# Patient Record
Sex: Female | Born: 1995
Health system: Southern US, Community
[De-identification: ages and names within clinical notes are randomized; demographics above are authoritative.]

## PROBLEM LIST (undated history)

## (undated) DIAGNOSIS — N946 Dysmenorrhea, unspecified: Secondary | ICD-10-CM

## (undated) DIAGNOSIS — J45909 Unspecified asthma, uncomplicated: Secondary | ICD-10-CM

## (undated) DIAGNOSIS — F419 Anxiety disorder, unspecified: Secondary | ICD-10-CM

## (undated) DIAGNOSIS — N939 Abnormal uterine and vaginal bleeding, unspecified: Secondary | ICD-10-CM

## (undated) DIAGNOSIS — F3181 Bipolar II disorder: Secondary | ICD-10-CM

## (undated) DIAGNOSIS — D649 Anemia, unspecified: Secondary | ICD-10-CM

## (undated) DIAGNOSIS — F329 Major depressive disorder, single episode, unspecified: Secondary | ICD-10-CM

## (undated) DIAGNOSIS — F32A Depression, unspecified: Secondary | ICD-10-CM

## (undated) HISTORY — DX: Abnormal uterine and vaginal bleeding, unspecified: N93.9

## (undated) HISTORY — DX: Anemia, unspecified: D64.9

## (undated) HISTORY — DX: Unspecified asthma, uncomplicated: J45.909

## (undated) HISTORY — DX: Bipolar II disorder: F31.81

## (undated) HISTORY — DX: Major depressive disorder, single episode, unspecified: F32.9

## (undated) HISTORY — PX: WISDOM TOOTH EXTRACTION: SHX21

## (undated) HISTORY — DX: Anxiety disorder, unspecified: F41.9

## (undated) HISTORY — DX: Depression, unspecified: F32.A

## (undated) HISTORY — DX: Dysmenorrhea, unspecified: N94.6

---
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
## 2018-01-08 ENCOUNTER — Encounter: Payer: Self-pay | Admitting: Family Medicine

## 2018-01-08 ENCOUNTER — Ambulatory Visit (INDEPENDENT_AMBULATORY_CARE_PROVIDER_SITE_OTHER): Payer: No Typology Code available for payment source | Admitting: Family Medicine

## 2018-01-08 VITALS — BP 118/60 | HR 85 | Temp 98.2°F | Ht 65.0 in | Wt 120.2 lb

## 2018-01-08 DIAGNOSIS — E559 Vitamin D deficiency, unspecified: Secondary | ICD-10-CM | POA: Diagnosis not present

## 2018-01-08 DIAGNOSIS — R5383 Other fatigue: Secondary | ICD-10-CM

## 2018-01-08 DIAGNOSIS — Z0000 Encounter for general adult medical examination without abnormal findings: Secondary | ICD-10-CM

## 2018-01-08 DIAGNOSIS — F411 Generalized anxiety disorder: Secondary | ICD-10-CM

## 2018-01-08 DIAGNOSIS — Z114 Encounter for screening for human immunodeficiency virus [HIV]: Secondary | ICD-10-CM | POA: Diagnosis not present

## 2018-01-08 DIAGNOSIS — E538 Deficiency of other specified B group vitamins: Secondary | ICD-10-CM

## 2018-01-08 DIAGNOSIS — Z13220 Encounter for screening for lipoid disorders: Secondary | ICD-10-CM

## 2018-01-08 DIAGNOSIS — Z789 Other specified health status: Secondary | ICD-10-CM | POA: Diagnosis not present

## 2018-01-08 LAB — CBC WITH DIFFERENTIAL/PLATELET
Basophils Absolute: 0 10*3/uL (ref 0.0–0.1)
Basophils Relative: 0.6 % (ref 0.0–3.0)
Eosinophils Absolute: 0.1 10*3/uL (ref 0.0–0.7)
Eosinophils Relative: 1.7 % (ref 0.0–5.0)
HCT: 38 % (ref 36.0–46.0)
Hemoglobin: 12.6 g/dL (ref 12.0–15.0)
Lymphocytes Relative: 47 % — ABNORMAL HIGH (ref 12.0–46.0)
Lymphs Abs: 2.3 10*3/uL (ref 0.7–4.0)
MCHC: 33 g/dL (ref 30.0–36.0)
MCV: 88.7 fl (ref 78.0–100.0)
Monocytes Absolute: 0.3 10*3/uL (ref 0.1–1.0)
Monocytes Relative: 5.4 % (ref 3.0–12.0)
Neutro Abs: 2.3 10*3/uL (ref 1.4–7.7)
Neutrophils Relative %: 45.3 % (ref 43.0–77.0)
Platelets: 212 10*3/uL (ref 150.0–400.0)
RBC: 4.29 Mil/uL (ref 3.87–5.11)
RDW: 13.3 % (ref 11.5–15.5)
WBC: 5 10*3/uL (ref 4.0–10.5)

## 2018-01-08 LAB — COMPREHENSIVE METABOLIC PANEL
ALT: 13 U/L (ref 0–35)
AST: 16 U/L (ref 0–37)
Albumin: 4.5 g/dL (ref 3.5–5.2)
Alkaline Phosphatase: 77 U/L (ref 39–117)
BUN: 7 mg/dL (ref 6–23)
CO2: 25 mEq/L (ref 19–32)
Calcium: 9.4 mg/dL (ref 8.4–10.5)
Chloride: 106 mEq/L (ref 96–112)
Creatinine, Ser: 0.72 mg/dL (ref 0.40–1.20)
GFR: 106.78 mL/min (ref >60.00)
Glucose, Bld: 93 mg/dL (ref 70–99)
Potassium: 4 mEq/L (ref 3.5–5.1)
Sodium: 139 mEq/L (ref 135–145)
Total Bilirubin: 0.5 mg/dL (ref 0.2–1.2)
Total Protein: 6.9 g/dL (ref 6.0–8.3)

## 2018-01-08 LAB — LIPID PANEL
Cholesterol: 120 mg/dL (ref 0–200)
HDL: 41.6 mg/dL (ref >39.00)
LDL Cholesterol: 63 mg/dL (ref 0–99)
NonHDL: 78.6
Total CHOL/HDL Ratio: 3
Triglycerides: 79 mg/dL (ref 0.0–149.0)
VLDL: 15.8 mg/dL (ref 0.0–40.0)

## 2018-01-08 LAB — TSH: TSH: 1.16 u[IU]/mL (ref 0.35–4.50)

## 2018-01-08 LAB — VITAMIN B12: Vitamin B-12: 140 pg/mL — ABNORMAL LOW (ref 211–911)

## 2018-01-08 LAB — VITAMIN D 25 HYDROXY (VIT D DEFICIENCY, FRACTURES): VITD: 20.5 ng/mL — ABNORMAL LOW (ref 30.00–100.00)

## 2018-01-08 NOTE — Progress Notes (Signed)
Subjective:    Sylvia Mathews is a 22 y.o. female and is here for a comprehensive physical exam.   Current Outpatient Medications:  .  buPROPion (WELLBUTRIN XL) 300 MG 24 hr tablet, Take 300 mg by mouth daily., Disp: , Rfl:  .  busPIRone (BUSPAR) 10 MG tablet, Take 20 mg by mouth daily., Disp: , Rfl:   Health Maintenance Due  Topic Date Due  . HIV Screening  02/10/2010  . TETANUS/TDAP  02/10/2014  . PAP SMEAR  02/11/2016    PMHx, SurgHx, SocialHx, Medications, and Allergies were reviewed in the Visit Navigator and updated as appropriate.   Past Medical History:  Diagnosis Date  . Asthma, childhood, exercise-induced   . Depression     History reviewed. No pertinent surgical history.   Family History  Problem Relation Age of Onset  . Heart attack Maternal Grandfather   . Hyperlipidemia Maternal Grandfather   . Mental illness Maternal Grandfather     Social History   Tobacco Use  . Smoking status: Never Smoker  . Smokeless tobacco: Never Used  Substance Use Topics  . Alcohol use: Yes    Comment: OCC  . Drug use: Never    Review of Systems:   Pertinent items are noted in the HPI. Otherwise, ROS is negative.  Objective:   BP 118/60   Pulse 85   Temp 98.2 F (36.8 C) (Oral)   Ht 5\' 5"  (1.651 m)   Wt 120 lb 3.2 oz (54.5 kg)   LMP 01/06/2018   SpO2 98%   BMI 20.00 kg/m    General appearance: alert, cooperative and appears stated age. Head: normocephalic, without obvious abnormality, atraumatic. Neck: no adenopathy, supple, symmetrical, trachea midline; thyroid not enlarged, symmetric, no tenderness/mass/nodules. Lungs: clear to auscultation bilaterally. Heart: regular rate and rhythm Abdomen: soft, non-tender; no masses,  no organomegaly. Extremities: extremities normal, atraumatic, no cyanosis or edema. Skin: skin color, texture, turgor normal, no rashes or lesions. Lymph: cervical, supraclavicular, and axillary nodes normal; no abnormal  inguinal nodes palpated. Neurologic: grossly normal.  Assessment/Plan:   Jamisyn was seen today for establish care.  Diagnoses and all orders for this visit:  Routine physical examination  Screening for HIV (human immunodeficiency virus) -     HIV Antibody (routine testing w rflx)  Vitamin D deficiency -     VITAMIN D 25 Hydroxy (Vit-D Deficiency, Fractures)  Other fatigue -     CBC with Differential/Platelet -     Comprehensive metabolic panel -     TSH -     Vitamin B12  Vegetarian -     CBC with Differential/Platelet -     Comprehensive metabolic panel  GAD (generalized anxiety disorder)  Screening for lipid disorders -     Lipid panel   Patient Counseling:   [x]     Nutrition: Stressed importance of moderation in sodium/caffeine intake, saturated fat and cholesterol, caloric balance, sufficient intake of fresh fruits, vegetables, fiber, calcium, iron, and 1 mg of folate supplement per day (for females capable of pregnancy).   [x]      Stressed the importance of regular exercise.    [x]     Substance Abuse: Discussed cessation/primary prevention of tobacco, alcohol, or other drug use; driving or other dangerous activities under the influence; availability of treatment for abuse.    [x]      Injury prevention: Discussed safety belts, safety helmets, smoke detector, smoking near bedding or upholstery.    [x]      Sexuality: Discussed  sexually transmitted diseases, partner selection, use of condoms, avoidance of unintended pregnancy  and contraceptive alternatives.    [x]     Dental health: Discussed importance of regular tooth brushing, flossing, and dental visits.   [x]      Health maintenance and immunizations reviewed. Please refer to Health maintenance section.   Helane RimaErica Demarlo Riojas, DO [place] [facility]

## 2018-01-09 ENCOUNTER — Encounter: Payer: Self-pay | Admitting: Family Medicine

## 2018-01-09 DIAGNOSIS — E559 Vitamin D deficiency, unspecified: Secondary | ICD-10-CM | POA: Insufficient documentation

## 2018-01-09 DIAGNOSIS — Z789 Other specified health status: Secondary | ICD-10-CM | POA: Insufficient documentation

## 2018-01-09 DIAGNOSIS — E538 Deficiency of other specified B group vitamins: Secondary | ICD-10-CM | POA: Insufficient documentation

## 2018-01-09 LAB — HIV ANTIBODY (ROUTINE TESTING W REFLEX): HIV 1&2 Ab, 4th Generation: NONREACTIVE

## 2018-01-31 ENCOUNTER — Ambulatory Visit (INDEPENDENT_AMBULATORY_CARE_PROVIDER_SITE_OTHER): Payer: No Typology Code available for payment source | Admitting: Psychiatry

## 2018-01-31 DIAGNOSIS — F411 Generalized anxiety disorder: Secondary | ICD-10-CM | POA: Diagnosis not present

## 2018-01-31 DIAGNOSIS — F3289 Other specified depressive episodes: Secondary | ICD-10-CM

## 2018-01-31 MED ORDER — BUPROPION HCL ER (XL) 300 MG PO TB24: 300.0000 mg | ORAL_TABLET | Freq: Every day | ORAL | 1 refills | Status: DC

## 2018-01-31 MED ORDER — BUSPIRONE HCL 10 MG PO TABS: ORAL_TABLET | ORAL | 0 refills | Status: DC

## 2018-01-31 MED ORDER — DULOXETINE HCL 30 MG PO CPEP: 30.0000 mg | ORAL_CAPSULE | Freq: Every day | ORAL | 0 refills | Status: DC

## 2018-01-31 MED ORDER — DULOXETINE HCL 60 MG PO CPEP: 60.0000 mg | ORAL_CAPSULE | Freq: Every day | ORAL | 0 refills | Status: DC

## 2018-01-31 MED FILL — DULoxetine HCL 60 MG CPEP: 60 | 30 days supply | Qty: 30 | Fill #0

## 2018-01-31 MED FILL — DULoxetine HCL 30 MG CPEP: 30 | 7 days supply | Qty: 7 | Fill #0

## 2018-01-31 MED FILL — buPROPion HCL ER (XL) 300 M: 300 | 30 days supply | Qty: 30 | Fill #0

## 2018-01-31 MED FILL — busPIRone HCL 10 MG TABS: 10 | 30 days supply | Qty: 90 | Fill #0
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
## 2018-02-22 ENCOUNTER — Ambulatory Visit (INDEPENDENT_AMBULATORY_CARE_PROVIDER_SITE_OTHER): Payer: No Typology Code available for payment source | Admitting: Psychiatry

## 2018-02-22 DIAGNOSIS — F329 Major depressive disorder, single episode, unspecified: Secondary | ICD-10-CM

## 2018-02-22 DIAGNOSIS — F411 Generalized anxiety disorder: Secondary | ICD-10-CM | POA: Diagnosis not present

## 2018-02-22 DIAGNOSIS — F32A Depression, unspecified: Secondary | ICD-10-CM

## 2018-02-22 MED ORDER — BUPROPION HCL ER (XL) 300 MG PO TB24: 300.0000 mg | ORAL_TABLET | Freq: Every day | ORAL | 1 refills | Status: DC

## 2018-02-22 MED ORDER — DULOXETINE HCL 30 MG PO CPEP: 30.0000 mg | ORAL_CAPSULE | Freq: Every day | ORAL | 1 refills | Status: DC

## 2018-02-22 MED FILL — buPROPion HCL ER (XL) 300 M: 300 | 30 days supply | Qty: 30 | Fill #0

## 2018-02-22 MED FILL — DULoxetine HCL 30 MG CPEP: 30 | 30 days supply | Qty: 30 | Fill #0

## 2018-02-22 NOTE — Progress Notes (Signed)
[facility] Med Check  Patient ID: Sylvia Mathews,  MRN: [id number]  PCP: Helane Rima, DO  Date of Evaluation: 02/22/2018 Time spent:20 minutes  Chief Complaint:   HISTORY/CURRENT STATUS: HPI patient's initial visit was 01/31/2018.  Diagnosis of anxiety and depression.  She was continued on Wellbutrin XL 300 mg.  We increased her BuSpar to 10 mg 3 times daily.  Cymbalta was started 30 to 60 mg a day. Several days after starting Cymbalta and increasing BuSpar she was hyper focused for 3 days.  She was restless, goal oriented and productive.  No talking more, no grandiosity, not impulsive, focus was good.  Those symptoms have resolved. Patient feels anxiety and depression are slightly better. Currently she feels numb.   Individual Medical History/ Review of Systems: Changes? :No   Allergies: Amoxicillin  Current Medications:  Current Outpatient Medications:  .  buPROPion (WELLBUTRIN XL) 300 MG 24 hr tablet, Take 300 mg by mouth daily., Disp: , Rfl:  .  buPROPion (WELLBUTRIN XL) 300 MG 24 hr tablet, Take 1 tablet (300 mg total) by mouth daily., Disp: 30 tablet, Rfl: 1 .  busPIRone (BUSPAR) 10 MG tablet, Take 1 tab tid, Disp: 90 tablet, Rfl: 0 .  DULoxetine (CYMBALTA) 30 MG capsule, Take 1 capsule (30 mg total) by mouth daily., Disp: 7 capsule, Rfl: 0 .  DULoxetine (CYMBALTA) 60 MG capsule, Take 1 capsule (60 mg total) by mouth daily., Disp: 30 capsule, Rfl: 0 Medication Side Effects: none  Family Medical/ Social History: Changes? no  MENTAL HEALTH EXAM:  There were no vitals taken for this visit.There is no height or weight on file to calculate BMI.  General Appearance: Casual  Eye Contact:  Good  Speech:  Clear and Coherent  Volume:  Normal  Mood:  Euthymic  Affect:  Appropriate  Thought Process:  Goal Directed  Orientation:  Full (Time, Place, and Person)  Thought Content: Logical   Suicidal Thoughts:  No  Homicidal Thoughts:  No  Memory:  WNL  Judgement:   Good  Insight:  Good  Psychomotor Activity:  Normal  Concentration:  Concentration: Good  Recall:  Good  Fund of Knowledge: Good  Language: Good  Assets:  Desire for Improvement  ADL's:  Intact  Cognition: WNL  Prognosis:  Good    DIAGNOSES: No diagnosis found.  Receiving Psychotherapy: No    RECOMMENDATIONS: Several options were given to the patient.  She can decrease her Cymbalta back to 30 mg a day and continue BuSpar and Wellbutrin.  We could switch her Cymbalta to Trintellix or Viibryd.  She she opts to to decrease her Cymbalta to 30 mg a day, continue Wellbutrin XL 300 a day, continue BuSpar 10 mg 3 times daily. She is to return in 1 month   [employer], [place]

## 2018-02-28 ENCOUNTER — Ambulatory Visit: Payer: No Typology Code available for payment source | Admitting: Psychiatry

## 2018-03-08 ENCOUNTER — Ambulatory Visit: Payer: No Typology Code available for payment source | Admitting: Physician Assistant

## 2018-03-22 ENCOUNTER — Ambulatory Visit (INDEPENDENT_AMBULATORY_CARE_PROVIDER_SITE_OTHER): Payer: No Typology Code available for payment source | Admitting: Psychiatry

## 2018-03-22 DIAGNOSIS — F411 Generalized anxiety disorder: Secondary | ICD-10-CM

## 2018-03-22 DIAGNOSIS — F329 Major depressive disorder, single episode, unspecified: Secondary | ICD-10-CM | POA: Diagnosis not present

## 2018-03-22 DIAGNOSIS — F32A Depression, unspecified: Secondary | ICD-10-CM

## 2018-03-22 MED ORDER — BUSPIRONE HCL 10 MG PO TABS: ORAL_TABLET | ORAL | 1 refills | Status: DC

## 2018-03-22 MED ORDER — BUPROPION HCL ER (XL) 300 MG PO TB24: 300.0000 mg | ORAL_TABLET | Freq: Every day | ORAL | 1 refills | Status: DC

## 2018-03-22 MED FILL — busPIRone HCL 10 MG TABS: 10 | 30 days supply | Qty: 150 | Fill #0

## 2018-03-22 MED FILL — buPROPion HCL ER (XL) 300 M: 300 | 30 days supply | Qty: 30 | Fill #0

## 2018-03-22 NOTE — Progress Notes (Signed)
[facility] Med Check  Patient ID: Sylvia Mathews,  MRN: [id number]  PCP: Helane Rima, DO  Date of Evaluation: 03/22/2018 Time spent:20 minutes  Chief Complaint:   HISTORY/CURRENT STATUS: HPI patient seen 1 month ago.  Her symptoms were slightly better.  She has been treating for anxiety and depression.  At last visit we decreased her Cymbalta to 30 mg a day, continue Wellbutrin XL 300 a day and continue BuSpar 10 mg 3 times daily she has some hyperactivity when she started Cymbalta and increased her BuSpar that as why we decrease the Cymbalta back to 30.  Anxiety and depression are better.  She still feels flat and numb to things     Individual Medical History/ Review of Systems: Changes? :No   Allergies: Amoxicillin  Current Medications:  Current Outpatient Medications:  .  buPROPion (WELLBUTRIN XL) 300 MG 24 hr tablet, Take 1 tablet (300 mg total) by mouth daily., Disp: 30 tablet, Rfl: 1 .  busPIRone (BUSPAR) 10 MG tablet, 2 tabs in am, 1 tab at lunch, 2 tabs in pm, Disp: 150 tablet, Rfl: 1 Medication Side Effects: none  Family Medical/ Social History: Changes? No  MENTAL HEALTH EXAM:  There were no vitals taken for this visit.There is no height or weight on file to calculate BMI.  General Appearance: Casual  Eye Contact:  Good  Speech:  Clear and Coherent  Volume:  Normal  Mood:  Euthymic  Affect:  Appropriate  Thought Process:  Linear  Orientation:  Full (Time, Place, and Person)  Thought Content: Logical   Suicidal Thoughts:  No  Homicidal Thoughts:  No  Memory:  WNL  Judgement:  Good  Insight:  Good  Psychomotor Activity:  Normal  Concentration:  Concentration: Good  Recall:  Good  Fund of Knowledge: Good  Language: Good  Assets:  Resilience  ADL's:  Intact  Cognition: WNL  Prognosis:  Good    DIAGNOSES:    ICD-10-CM   1. Depression, unspecified depression type F32.9   2. GAD (generalized anxiety disorder) F41.1 buPROPion (WELLBUTRIN  XL) 300 MG 24 hr tablet    busPIRone (BUSPAR) 10 MG tablet    Receiving Psychotherapy: No    RECOMMENDATIONS: Nahomy's main concern today is the anxiety and the feeling flat with no emotions.  We will stop her Cymbalta.  She is discontinuing Wellbutrin XL 300 mg a day.  She is going to increase her BuSpar from 10 mg 3 times daily to 20 mg in the morning 1 at lunch and 2 in the evening.  We will see her back in 1 month.  In the past she has tried Prozac, Celexa, Vistaril, Cymbalta, and Wellbutrin.   Anne Fu, PA-C

## 2018-04-04 MED FILL — busPIRone HCL 10 MG TABS: 10 | 30 days supply | Qty: 150 | Fill #0

## 2018-04-04 MED FILL — buPROPion HCL ER (XL) 300 M: 300 | 30 days supply | Qty: 30 | Fill #0

## 2018-04-05 ENCOUNTER — Encounter: Payer: Self-pay | Admitting: Physician Assistant

## 2018-04-05 ENCOUNTER — Other Ambulatory Visit (HOSPITAL_COMMUNITY)
Admission: RE | Admit: 2018-04-05 | Discharge: 2018-04-05 | Disposition: A | Discharge status: Home / Self Care | Payer: No Typology Code available for payment source | Source: Ambulatory Visit | Attending: Physician Assistant | Admitting: Physician Assistant

## 2018-04-05 ENCOUNTER — Telehealth: Payer: Self-pay | Admitting: Family Medicine

## 2018-04-05 ENCOUNTER — Ambulatory Visit (INDEPENDENT_AMBULATORY_CARE_PROVIDER_SITE_OTHER): Payer: No Typology Code available for payment source | Admitting: Physician Assistant

## 2018-04-05 VITALS — BP 110/80 | HR 90 | Temp 97.3°F | Ht 65.0 in | Wt 112.0 lb

## 2018-04-05 DIAGNOSIS — Z202 Contact with and (suspected) exposure to infections with a predominantly sexual mode of transmission: Secondary | ICD-10-CM | POA: Diagnosis present

## 2018-04-05 NOTE — Telephone Encounter (Signed)
Called patient put on your schedule this afternoon.

## 2018-04-05 NOTE — Patient Instructions (Signed)

## 2018-04-05 NOTE — Progress Notes (Signed)
Sylvia Mathews is a 23 y.o. female here for a new problem.  I acted as a Neurosurgeon for [employer], PA-C Sylvia Mull, LPN  History of Present Illness:   Chief Complaint  Patient presents with  . Sexual Transmitted infection    Pt had unprotected sex 1 week ago    Female GU Problem  The patient's primary symptoms include genital itching. This is a new problem. Episode onset: Started a week ago. The problem occurs intermittently. The problem has been unchanged. The patient is experiencing no pain. The problem affects both sides. She is not pregnant. Pertinent negatives include no chills, discolored urine, dysuria, fever, headaches, nausea, painful intercourse or vomiting. Vaginal discharge characteristics: bright red/brown. The vaginal bleeding is spotting (periods irregular due to IUD). She has not been passing clots. She has not been passing tissue. Nothing aggravates the symptoms. She has tried nothing for the symptoms. She is sexually active. No, her partner does not have an STD. She uses an IUD and condoms (pt had unprotected sex a week ago) for contraception. Her menstrual history has been irregular. There is no history of an STD.   She states that she may had a female and female partner. No history of STDs.  No LMP recorded. (Menstrual status: IUD). Currently on period, per patient.   Past Medical History:  Diagnosis Date  . Asthma, childhood, exercise-induced   . Depression      Social History   Socioeconomic History  . Marital status: Single    Spouse name: Not on file  . Number of children: Not on file  . Years of education: Not on file  . Highest education level: Bachelor's degree (e.g., BA, AB, BS)  Occupational History  . 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 Financial resource strain: Not on file  . Food insecurity:    Worry: Not on file    Inability: Not on file  . Transportation needs:    Medical:  Not on file    Non-medical: Not on file  Tobacco Use  . Smoking status: Never Smoker  . Smokeless tobacco: Never Used  Substance and Sexual Activity  . Alcohol use: Yes    Comment: OCC  . Drug use: Never  . Sexual activity: Yes    Partners: Female, Female    Birth control/protection: I.U.D.  Lifestyle  . Physical activity:    Days per week: Not on file    Minutes per session: Not on file  . Stress: Not on file  Relationships  . Social connections:    Talks on phone: Not on file    Gets together: Not on file    Attends religious service: Not on file    Active member of club or organization: Not on file    Attends meetings of clubs or organizations: Not on file    Relationship status: Not on file  . Intimate partner violence:    Fear of current or ex partner: Not on file    Emotionally abused: Not on file    Physically abused: Not on file    Forced sexual activity: Not on file  Other Topics Concern  . Not on file  Social History Narrative  . Not on file    History reviewed. No pertinent surgical history.  Family History  Problem Relation Age of Onset  . Heart attack Maternal Grandfather   . Hyperlipidemia Maternal Grandfather   .  Mental illness Maternal Grandfather     Allergies  Allergen Reactions  . Amoxicillin Rash    Current Medications:   Current Outpatient Medications:  .  buPROPion (WELLBUTRIN XL) 300 MG 24 hr tablet, Take 1 tablet (300 mg total) by mouth daily., Disp: 30 tablet, Rfl: 1 .  busPIRone (BUSPAR) 10 MG tablet, 2 tabs in am, 1 tab at lunch, 2 tabs in pm, Disp: 150 tablet, Rfl: 1   Review of Systems:   Review of Systems  Constitutional: Negative for chills and fever.  Gastrointestinal: Negative for nausea and vomiting.  Genitourinary: Negative for dysuria.  Neurological: Negative for headaches.    Vitals:   Vitals:   04/05/18 1329  BP: 110/80  Pulse: 90  Temp: (!) 97.3 F (36.3 C)  TempSrc: Oral  SpO2: 99%  Weight: 112 lb (50.8  kg)  Height: 5\' 5"  (1.651 m)     Body mass index is 18.64 kg/m.  Physical Exam:   Physical Exam Vitals signs and nursing note reviewed. Exam conducted with a chaperone present.  Constitutional:      General: She is not in acute distress.    Appearance: She is well-developed. She is not ill-appearing or toxic-appearing.  Cardiovascular:     Rate and Rhythm: Normal rate and regular rhythm.     Pulses: Normal pulses.     Heart sounds: Normal heart sounds, S1 normal and S2 normal.     Comments: No LE edema Pulmonary:     Effort: Pulmonary effort is normal.     Breath sounds: Normal breath sounds.  Genitourinary:    Labia:        Right: No rash, tenderness or lesion.        Left: No rash, tenderness or lesion.      Vagina: Normal. No tenderness or bleeding.     Cervix: No friability or erythema.     Uterus: Normal.      Adnexa: Right adnexa normal and left adnexa normal.     Comments: No lesions present. Possible irritated hair follicles around lower pubic hair line. Skin:    General: Skin is warm and dry.  Neurological:     Mental Status: She is alert.     GCS: GCS eye subscore is 4. GCS verbal subscore is 5. GCS motor subscore is 6.  Psychiatric:        Speech: Speech normal.        Behavior: Behavior normal. Behavior is cooperative.     Assessment and Plan:   Sylvia Mathews was seen today for sexual transmitted infection.  Diagnoses and all orders for this visit:  Possible exposure to STD -     RPR -     HIV Antibody (routine testing w rflx) -     Cervicovaginal ancillary only( Sylvia Mathews)   No red flags on exam. No visible lesions. No CMT or other concerns. Vaginal swab and RPR/HIV obtained on patient. Encouraged safe sex use. Avoid sexual activity until results have returned.   . Reviewed expectations re: course of current medical issues. . Discussed self-management of symptoms. . Outlined signs and symptoms indicating need for more acute intervention. . Patient  verbalized understanding and all questions were answered. . See orders for this visit as documented in the electronic medical record. . Patient received an After-Visit Summary.  CMA or LPN served as scribe during this visit. History, Physical, and Plan performed by medical provider. The above documentation has been reviewed and is accurate and complete.  Inda Coke, PA-C

## 2018-04-05 NOTE — Telephone Encounter (Signed)
OK for pt to see another provider or work in pt?   Copied from CRM [id number]. Topic: Appointment Scheduling - Scheduling Inquiry for Clinic >> Apr 04, 2018  3:01 PM Windy Kalata, NT wrote: Reason for CRM: patient is calling and would like to come in and see Dr. Earlene Plater for a STD screening. First available is 04/22/18 and she would like to be seen before then.

## 2018-04-08 ENCOUNTER — Other Ambulatory Visit: Payer: Self-pay | Admitting: Physician Assistant

## 2018-04-08 LAB — CERVICOVAGINAL ANCILLARY ONLY
Bacterial vaginitis: POSITIVE — AB
CANDIDA VAGINITIS: NEGATIVE
Chlamydia: NEGATIVE
Neisseria Gonorrhea: NEGATIVE
Trichomonas: NEGATIVE

## 2018-04-08 LAB — RPR: RPR Ser Ql: NONREACTIVE

## 2018-04-08 LAB — HIV ANTIBODY (ROUTINE TESTING W REFLEX): HIV: NONREACTIVE

## 2018-04-08 MED ORDER — METRONIDAZOLE 500 MG PO TABS: 500.0000 mg | ORAL_TABLET | Freq: Two times a day (BID) | ORAL | 0 refills | Status: AC

## 2018-04-09 ENCOUNTER — Telehealth: Payer: Self-pay | Admitting: Family Medicine

## 2018-04-09 MED FILL — metroNIDAZOLE 500 MG TABS: 500 | 7 days supply | Qty: 14 | Fill #0

## 2018-04-09 NOTE — Telephone Encounter (Signed)
Provided  Lab  Results  Per  Jarold Motto,  PA  On  04/08/18  Patient  Voiced  Understanding of  instructions

## 2018-04-19 ENCOUNTER — Ambulatory Visit (INDEPENDENT_AMBULATORY_CARE_PROVIDER_SITE_OTHER): Payer: No Typology Code available for payment source | Admitting: Psychiatry

## 2018-04-19 DIAGNOSIS — F411 Generalized anxiety disorder: Secondary | ICD-10-CM

## 2018-04-19 DIAGNOSIS — F3341 Major depressive disorder, recurrent, in partial remission: Secondary | ICD-10-CM

## 2018-04-19 MED ORDER — SERTRALINE HCL 50 MG PO TABS: ORAL_TABLET | ORAL | 1 refills | Status: DC

## 2018-04-19 MED ORDER — BUPROPION HCL ER (XL) 300 MG PO TB24: 300.0000 mg | ORAL_TABLET | Freq: Every day | ORAL | 1 refills | Status: DC

## 2018-04-19 MED ORDER — BUSPIRONE HCL 10 MG PO TABS: ORAL_TABLET | ORAL | 1 refills | Status: DC

## 2018-04-19 NOTE — Progress Notes (Signed)
[facility] Check  Patient ID: Sylvia Mathews,  MRN: [id number]  PCP: Helane Rima, DO  Date of Evaluation: 04/19/2018 Time spent:20 minutes  Chief Complaint:   HISTORY/CURRENT STATUS: HPI patient seen 03/22/2018.  She was doing okay overall.  She does had does have some anxiety.  Also was nauseous on Wellbutrin.  I had her go off the Cymbalta to stay on the Wellbutrin and increase the buspirone. Currently she is feeling better her anxiety is manageable.  Depression is better.  She describes nausea she takes it in the morning she is nauseated on Wellbutrin.  If she takes it at night she is not nauseated but she has decreased sleep.  Does have some impulsive shopping which she calls retail  Therapy. Feelings of feeling flat are not too bad now and she has more motivation. She also sees a Veterinary surgeon every 2 weeks.  Individual Medical History/ Review of Systems: Changes? :No   Allergies: Amoxicillin  Current Medications:  Current Outpatient Medications:  .  buPROPion (WELLBUTRIN XL) 300 MG 24 hr tablet, Take 1 tablet (300 mg total) by mouth daily., Disp: 30 tablet, Rfl: 1 .  busPIRone (BUSPAR) 10 MG tablet, 2 tabs in am, 1 tab at lunch, 2 tabs in pm, Disp: 150 tablet, Rfl: 1 .  sertraline (ZOLOFT) 50 MG tablet, 1/2 tab per day for a week, then 1 tab per day, Disp: 30 tablet, Rfl: 1 Medication Side Effects: none  Family Medical/ Social History: Changes? No  MENTAL HEALTH EXAM:  There were no vitals taken for this visit.There is no height or weight on file to calculate BMI.  General Appearance: Casual  Eye Contact:  Good  Speech:  Clear and Coherent  Volume:  Normal  Mood:  Euthymic  Affect:  Appropriate  Thought Process:  Linear  Orientation:  Full (Time, Place, and Person)  Thought Content: Logical   Suicidal Thoughts:  No  Homicidal Thoughts:  No  Memory:  WNL  Judgement:  Good  Insight:  Good  Psychomotor Activity:  Normal  Concentration:  Concentration:  Good  Recall:  Good  Fund of Knowledge: Good  Language: Good  Assets:  Desire for Improvement  ADL's:  Intact  Cognition: WNL  Prognosis:  Good    DIAGNOSES:    ICD-10-CM   1. Recurrent major depressive disorder, in partial remission (HCC) F33.41   2. GAD (generalized anxiety disorder) F41.1 buPROPion (WELLBUTRIN XL) 300 MG 24 hr tablet    busPIRone (BUSPAR) 10 MG tablet    Receiving Psychotherapy: Yes    RECOMMENDATIONS: We will keep her on her current medication which is BuSpar 10 mg 2 in the morning 1 at lunch and 2 at supper.  We will also continue the Wellbutrin XL 300 a day.  She will start Zoloft 50 mg one half tab a day for a week and then 1 tab a day.  She continues to see her counselor every other week.  We will follow her for feeling flat and also for sleeping.  Return in 6 weeks.   Anne Fu, PA-C

## 2018-04-30 MED FILL — SERTRALINE HCL 50 MG TABLET: 50 | 30 days supply | Qty: 30 | Fill #0

## 2018-05-27 ENCOUNTER — Ambulatory Visit: Payer: No Typology Code available for payment source | Admitting: Psychiatry

## 2018-07-12 ENCOUNTER — Other Ambulatory Visit: Payer: Self-pay

## 2018-07-12 ENCOUNTER — Encounter: Payer: Self-pay | Admitting: Physician Assistant

## 2018-07-12 ENCOUNTER — Ambulatory Visit (INDEPENDENT_AMBULATORY_CARE_PROVIDER_SITE_OTHER): Payer: No Typology Code available for payment source | Admitting: Physician Assistant

## 2018-07-12 ENCOUNTER — Other Ambulatory Visit (HOSPITAL_COMMUNITY)
Admission: RE | Admit: 2018-07-12 | Discharge: 2018-07-12 | Disposition: A | Discharge status: Home / Self Care | Payer: No Typology Code available for payment source | Source: Ambulatory Visit | Attending: Physician Assistant | Admitting: Physician Assistant

## 2018-07-12 VITALS — Ht 65.0 in | Wt 115.0 lb

## 2018-07-12 DIAGNOSIS — N898 Other specified noninflammatory disorders of vagina: Secondary | ICD-10-CM

## 2018-07-12 LAB — POC URINALSYSI DIPSTICK (AUTOMATED)
Bilirubin, UA: NEGATIVE
Blood, UA: POSITIVE
Glucose, UA: NEGATIVE
Ketones, UA: NEGATIVE
Leukocytes, UA: NEGATIVE
Nitrite, UA: NEGATIVE
Protein, UA: NEGATIVE
Spec Grav, UA: 1.015 (ref 1.010–1.025)
Urobilinogen, UA: 0.2 E.U./dL
pH, UA: 6 (ref 5.0–8.0)

## 2018-07-12 LAB — POCT URINE PREGNANCY: Preg Test, Ur: NEGATIVE

## 2018-07-12 MED ORDER — METRONIDAZOLE 500 MG PO TABS: 500.0000 mg | ORAL_TABLET | Freq: Two times a day (BID) | ORAL | 0 refills | Status: AC

## 2018-07-12 NOTE — Progress Notes (Signed)
Virtual Visit via Video   I connected with Sylvia Mathews on 07/12/18 at 10:40 AM EDT by a video enabled telemedicine application and verified that I am speaking with the correct person using two identifiers. Location patient: Home Location provider:  [facility], Office Persons participating in the virtual visit: Sylvia Mathews, Jarold Motto PA-C, Sylvia Mull, LPN   I discussed the limitations of evaluation and management by telemedicine and the availability of in person appointments. The patient expressed understanding and agreed to proceed.  I acted as a Neurosurgeon for [employer], [employer], LPN  Subjective:   HPI:   Vaginal discharge Pt c/o vaginal discharge, pink spotting with odor x 2 days. Having slight cramps that are reminiscent of her period cramps. Denies back pain, fever or chills.  Has not taken any medication. Pt has IUD in place and checked her strings at the beginning of the month.  She is sexually active and uses protection. Denies concerns for STDs. Denies any lesions.  Recent BV confirmed in our office in Feb 2020. She states sx are the same.  ROS: See pertinent positives and negatives per HPI.  Patient Active Problem List   Diagnosis Date Noted  . Vegetarian 01/09/2018  . Vitamin D deficiency 01/09/2018  . B12 deficiency 01/09/2018    Social History   Tobacco Use  . Smoking status: Never Smoker  . Smokeless tobacco: Never Used  Substance Use Topics  . Alcohol use: Yes    Comment: OCC    Current Outpatient Medications:  .  buPROPion (WELLBUTRIN XL) 300 MG 24 hr tablet, Take 1 tablet (300 mg total) by mouth daily., Disp: 30 tablet, Rfl: 1 .  busPIRone (BUSPAR) 10 MG tablet, 2 tabs in am, 1 tab at lunch, 2 tabs in pm, Disp: 150 tablet, Rfl: 1 .  levonorgestrel (MIRENA, 52 MG,) 20 MCG/24HR IUD, Inserted Sept 2018, needs to be removed 2023., Disp: , Rfl:  .  metroNIDAZOLE (FLAGYL) 500 MG tablet, Take 1 tablet (500 mg  total) by mouth 2 (two) times daily for 7 days., Disp: 14 tablet, Rfl: 0  Allergies  Allergen Reactions  . Amoxicillin Rash    Objective:   VITALS: Per patient if applicable, see vitals. GENERAL: Alert, appears well and in no acute distress. HEENT: Atraumatic, conjunctiva clear, no obvious abnormalities on inspection of external nose and ears. NECK: Normal movements of the head and neck. CARDIOPULMONARY: No increased WOB. Speaking in clear sentences. I:E ratio WNL.  MS: Moves all visible extremities without noticeable abnormality. PSYCH: Pleasant and cooperative, well-groomed. Speech normal rate and rhythm. Affect is appropriate. Insight and judgement are appropriate. Attention is focused, linear, and appropriate.  NEURO: CN grossly intact. Oriented as arrived to appointment on time with no prompting. Moves both UE equally.  SKIN: No obvious lesions, wounds, erythema, or cyanosis noted on face or hands.  Results for orders placed or performed in visit on 07/12/18  POCT urine pregnancy  Result Value Ref Range   Preg Test, Ur Negative Negative  POCT Urinalysis Dipstick (Automated)  Result Value Ref Range   Color, UA Yellow    Clarity, UA Clear    Glucose, UA Negative Negative   Bilirubin, UA Negative    Ketones, UA Negative    Spec Grav, UA 1.015 1.010 - 1.025   Blood, UA Positive    pH, UA 6.0 5.0 - 8.0   Protein, UA Negative Negative   Urobilinogen, UA 0.2 0.2 or 1.0 E.U./dL   Nitrite,  UA Negative    Leukocytes, UA Negative Negative    Assessment and Plan:   Sylvia Mathews was seen today for vaginal odor and vaginal discharge.  Diagnoses and all orders for this visit:  Vaginal discharge Suspect BV however she is agreeable to coming into the office to self-swab and do UA/upreg. Will send in flagyl for suspected BV at this time. Further interventions based on results. -     POCT urine pregnancy -     POCT Urinalysis Dipstick (Automated) -     Cervicovaginal ancillary only( [facility])  Other orders -     metroNIDAZOLE (FLAGYL) 500 MG tablet; Take 1 tablet (500 mg total) by mouth 2 (two) times daily for 7 days.   . Reviewed expectations re: course of current medical issues. . Discussed self-management of symptoms. . Outlined signs and symptoms indicating need for more acute intervention. . Patient verbalized understanding and all questions were answered. [name]. Health Maintenance issues including appropriate healthy diet, exercise, and smoking avoidance were discussed with patient. . See orders for this visit as documented in the electronic medical record.  I discussed the assessment and treatment plan with the patient. The patient was provided an opportunity to ask questions and all were answered. The patient agreed with the plan and demonstrated an understanding of the instructions.   The patient was advised to call back or seek an in-person evaluation if the symptoms worsen or if the condition fails to improve as anticipated.   CMA or LPN served as scribe during this visit. History, Physical, and Plan performed by medical provider. The above documentation has been reviewed and is accurate and complete.   PetersburgSamantha Jc Veron, Georgia[place] 07/12/2018

## 2018-07-13 MED FILL — METRONIDAZOLE 500 MG TABS: 500 | 7 days supply | Qty: 14 | Fill #0

## 2018-07-16 ENCOUNTER — Other Ambulatory Visit: Payer: Self-pay | Admitting: Physician Assistant

## 2018-07-16 LAB — CERVICOVAGINAL ANCILLARY ONLY
Bacterial vaginitis: POSITIVE — AB
Candida vaginitis: POSITIVE — AB
Chlamydia: NEGATIVE
Neisseria Gonorrhea: NEGATIVE
Trichomonas: NEGATIVE

## 2018-07-16 MED ORDER — FLUCONAZOLE 150 MG PO TABS: 150.0000 mg | ORAL_TABLET | Freq: Once | ORAL | 0 refills | Status: AC

## 2018-07-16 MED FILL — FLUCONAZOLE 150 MG TABS: 150 | 1 days supply | Qty: 1 | Fill #0

## 2018-09-02 ENCOUNTER — Ambulatory Visit: Payer: No Typology Code available for payment source | Admitting: Psychiatry

## 2018-09-23 DIAGNOSIS — F32A Depression, unspecified: Secondary | ICD-10-CM | POA: Insufficient documentation

## 2018-09-23 DIAGNOSIS — F419 Anxiety disorder, unspecified: Secondary | ICD-10-CM | POA: Insufficient documentation

## 2018-09-23 DIAGNOSIS — R102 Pelvic and perineal pain: Secondary | ICD-10-CM | POA: Insufficient documentation

## 2018-09-23 LAB — HM PAP SMEAR

## 2018-09-23 MED FILL — DROSPIR-ETH ESTRA 3/.02 MG: 3-0.02 | 28 days supply | Qty: 28 | Fill #0

## 2018-10-02 ENCOUNTER — Other Ambulatory Visit: Payer: Self-pay

## 2018-10-02 ENCOUNTER — Ambulatory Visit (INDEPENDENT_AMBULATORY_CARE_PROVIDER_SITE_OTHER): Payer: No Typology Code available for payment source | Admitting: Psychiatry

## 2018-10-02 ENCOUNTER — Encounter: Payer: Self-pay | Admitting: Psychiatry

## 2018-10-02 VITALS — Wt 118.0 lb

## 2018-10-02 DIAGNOSIS — F329 Major depressive disorder, single episode, unspecified: Secondary | ICD-10-CM | POA: Diagnosis not present

## 2018-10-02 DIAGNOSIS — F32A Depression, unspecified: Secondary | ICD-10-CM

## 2018-10-02 MED ORDER — LAMOTRIGINE 25 MG PO TABS: ORAL_TABLET | ORAL | 0 refills | Status: DC

## 2018-10-02 MED FILL — lamoTRIgine 25 MG TABS: 25 | 28 days supply | Qty: 45 | Fill #0

## 2018-10-02 NOTE — Progress Notes (Signed)
Sylvia PigeonBailey Nicole Paszkiewicz [id number] August 09, 1995 22 y.o.  Subjective:   Patient ID:  Sylvia PorterBailey Nicole Mathews is a 23 y.o. (DOB August 09, 1995) female.  Chief Complaint:  Chief Complaint  Patient presents with  . Depression  . Anxiety  . Sleeping Problem    HPI Sylvia Mathews presents to the office today for follow-up of anxiety and depression. "Recently my depression has been worse than my anxiety." Has been experiencing persistent sad mood. Low motivation and energy. Reports decreased sleep due to working night shifts and has difficulty sleeping during the day. Has difficulty falling asleep at times when coming off night shift. Reports periods of decreased sleep at times and then sleeping longer periods when she is able to sleep at night. Appetite has been decreased due to nausea with working at night. Occ nausea at home. Describes concentration as "non-existent." Has had some anxiety at times, typically at work with increased stress and in social situations. Reports that she has had one panic attacks that was associated with lack of sleep. Denies SI- "I want my life like this to be over" and wanting to change jobs, situations, etc.   Reports mood has been predominantly depressed since age 23. Reports that she has 3-4 day periods of high energy and motivation. Reports that she has had periods where she did not need as much sleep and mood was more elevated. Reports she has not slept for the last 36 hours. H/o impulsive purchases.  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 Works night shift.   Reports that she has stopped taking medication since it was difficulty taking medications consistently.  Sees a counselor, Teodoro SprayPeter Reich, 1-2 times a month.   Past Psychiatric Medication Trials: Wellbutrin- Initially effective and then no longer as effective. May be causing some nausea.  Buspar- Minimally effective.  Zoloft- Nausea.  Citalopram- Intense  tachycardia and increased heart rate. Prozac- Lost 20 lbs in a short period of time due to nausea and loss of appetite. Went a week without sleeping, impulsive purchases and piercing's. Cymbalta- Ineffective. Felt "numb." Pristiq- did not like.    Review of Systems:  Review of Systems  Gastrointestinal: Positive for nausea.  Musculoskeletal: Negative for gait problem.  Neurological: Negative for tremors.  Psychiatric/Behavioral:       Please refer to HPI    Medications: I have reviewed the patient's current medications.  Current Outpatient Medications  Medication Sig Dispense Refill  . drospirenone-ethinyl estradiol (LORYNA) 3-0.02 MG tablet Take 1 tablet by mouth daily.    [name]. buPROPion (WELLBUTRIN XL) 300 MG 24 hr tablet Take 1 tablet (300 mg total) by mouth daily. (Patient not taking: Reported on 10/02/2018) 30 tablet 1  . busPIRone (BUSPAR) 10 MG tablet 2 tabs in am, 1 tab at lunch, 2 tabs in pm (Patient not taking: Reported on 10/02/2018) 150 tablet 1  . lamoTRIgine (LAMICTAL) 25 MG tablet Take 1 tablet (25 mg total) by mouth daily for 14 days, THEN 2 tablets (50 mg total) daily for 14 days. 45 tablet 0   No current facility-administered medications for this visit.     Medication Side Effects: Other: N/A  Allergies:  Allergies  Allergen Reactions  . Amoxicillin Rash    Past Medical History:  Diagnosis Date  . Asthma, childhood, exercise-induced   . Depression     Family History  Problem Relation Age of Onset  . Heart attack Maternal Grandfather   . Hyperlipidemia Maternal Grandfather   .  Mental illness Maternal Grandfather   . Schizophrenia Maternal Grandfather   . Mood Disorder Maternal Uncle     Social History   Socioeconomic History  . Marital status: Single    Spouse name: Not on file  . Number of children: Not on file  . Years of education: Not on file  . Highest education level: Bachelor's degree (e.g., BA, AB, BS)  Occupational History  . Occupation:  Programmer, multimedia: [employer]  Social Needs  . Financial resource strain: Not on file  . Food insecurity    Worry: Not on file    Inability: Not on file  . Transportation needs    Medical: Not on file    Non-medical: Not on file  Tobacco Use  . Smoking status: Never Smoker  . Smokeless tobacco: Never Used  Substance and Sexual Activity  . Alcohol use: Yes    Comment: OCC  . Drug use: Never  . Sexual activity: Yes    Partners: Female, Female    Birth control/protection: I.U.D.  Lifestyle  . Physical activity    Days per week: Not on file    Minutes per session: Not on file  . Stress: Not on file  Relationships  . Social Herbalist on phone: Not on file    Gets together: Not on file    Attends religious service: Not on file    Active member of club or organization: Not on file    Attends meetings of clubs or organizations: Not on file    Relationship status: Not on file  . Intimate partner violence    Fear of current or ex partner: Not on file    Emotionally abused: Not on file    Physically abused: Not on file    Forced sexual activity: Not on file  Other Topics Concern  . Not on file  Social History Narrative  . Not on file    Past Medical History, Surgical history, Social history, and Family history were reviewed and updated as appropriate.   Please see review of systems for further details on the patient's review from today.   Objective:   Physical Exam:  Wt 118 lb (53.5 kg)   BMI 19.64 kg/m   Physical Exam Constitutional:      General: She is not in acute distress.    Appearance: She is well-developed.  Musculoskeletal:        General: No deformity.  Neurological:     Mental Status: She is alert and oriented to person, place, and time.     Coordination: Coordination normal.  Psychiatric:        Attention and Perception: Attention and perception normal. She does not perceive auditory or visual hallucinations.         Mood and Affect: Mood is anxious and depressed. Affect is not labile, blunt, angry or inappropriate.        Speech: Speech normal.        Behavior: Behavior normal.        Thought Content: Thought content normal. Thought content does not include homicidal or suicidal ideation. Thought content does not include homicidal or suicidal plan.        Cognition and Memory: Cognition and memory normal.        Judgment: Judgment normal.     Comments: Insight intact. No delusions.      Lab Review:     Component Value Date/Time  NA 139 01/08/2018 1341   K 4.0 01/08/2018 1341   CL 106 01/08/2018 1341   CO2 25 01/08/2018 1341   GLUCOSE 93 01/08/2018 1341   BUN 7 01/08/2018 1341   CREATININE 0.72 01/08/2018 1341   CALCIUM 9.4 01/08/2018 1341   PROT 6.9 01/08/2018 1341   ALBUMIN 4.5 01/08/2018 1341   AST 16 01/08/2018 1341   ALT 13 01/08/2018 1341   ALKPHOS 77 01/08/2018 1341   BILITOT 0.5 01/08/2018 1341       Component Value Date/Time   WBC 5.0 01/08/2018 1341   RBC 4.29 01/08/2018 1341   HGB 12.6 01/08/2018 1341   HCT 38.0 01/08/2018 1341   PLT 212.0 01/08/2018 1341   MCV 88.7 01/08/2018 1341   MCHC 33.0 01/08/2018 1341   RDW 13.3 01/08/2018 1341   LYMPHSABS 2.3 01/08/2018 1341   MONOABS 0.3 01/08/2018 1341   EOSABS 0.1 01/08/2018 1341   BASOSABS 0.0 01/08/2018 1341    No results found for: POCLITH, LITHIUM   No results found for: PHENYTOIN, PHENOBARB, VALPROATE, CBMZ   .res Assessment: Plan:   Discussed that reported mood history may be consistent with bipolar disorder type II based upon history of predominantly depressive episodes with some possible brief periods of hypomania.  Discussed medications used to treat bipolar depression and that these medications are also used to treat unipolar depression that has not responded to SSRIs and SNRIs. Counseled patient regarding potential benefits, risks, and side effects of Lamictal to include potential risk of Stevens-Johnson  syndrome. Advised patient to stop taking Lamictal and contact office immediately if rash develops and to seek urgent medical attention if rash is severe and/or spreading quickly. Will start Lamictal 25 mg daily for 2 weeks, then increase to 50 mg daily for mood stabilization. Patient to follow-up with this provider in 4 weeks or sooner if clinically indicated. Patient advised to contact office with any questions, adverse effects, or acute worsening in signs and symptoms.  [name] was seen today for depression, anxiety and sleeping problem.  Diagnoses and all orders for this visit:  Depression, unspecified depression type -     lamoTRIgine (LAMICTAL) 25 MG tablet; Take 1 tablet (25 mg total) by mouth daily for 14 days, THEN 2 tablets (50 mg total) daily for 14 days.     Please see After Visit Summary for patient specific instructions.  Future Appointments  Date Time Provider Department Center  10/24/2018  2:00 PM Genia DelLavoie, Marie-Lyne, MD [facility]  10/31/2018  9:30 AM Corie Chiquitoarter, Linzy Darling, PMHNP [facility] None    No orders of the defined types were placed in this encounter.   -------------------------------

## 2018-10-08 ENCOUNTER — Telehealth: Payer: Self-pay | Admitting: Psychiatry

## 2018-10-08 ENCOUNTER — Telehealth: Payer: Self-pay | Admitting: Physician Assistant

## 2018-10-08 NOTE — Telephone Encounter (Signed)
See previous phone message. 

## 2018-10-08 NOTE — Telephone Encounter (Signed)
Pt called and talked to the on call provider last nigh about a reaction to her meds. She wanted to see if someone could call her back before she has class this evening.

## 2018-10-08 NOTE — Telephone Encounter (Signed)
See phone note

## 2018-10-09 ENCOUNTER — Other Ambulatory Visit: Payer: Self-pay

## 2018-10-09 ENCOUNTER — Encounter: Payer: Self-pay | Admitting: Psychiatry

## 2018-10-09 ENCOUNTER — Ambulatory Visit (INDEPENDENT_AMBULATORY_CARE_PROVIDER_SITE_OTHER): Payer: No Typology Code available for payment source | Admitting: Psychiatry

## 2018-10-09 VITALS — Wt 117.0 lb

## 2018-10-09 DIAGNOSIS — F39 Unspecified mood [affective] disorder: Secondary | ICD-10-CM

## 2018-10-09 MED ORDER — LATUDA 20 MG PO TABS: ORAL_TABLET | ORAL | 0 refills | Status: DC

## 2018-10-09 NOTE — Progress Notes (Signed)
Sylvia Mathews [id number] 09/29/1995 23 y.o.  Subjective:   Patient ID:  Sylvia Mathews is a 23 y.o. (DOB 28-May-1995) female.  Chief Complaint:  Chief Complaint  Patient presents with  . Medication Reaction    Rash     HPI Sylvia Mathews presents to the office today for follow-up due to med reaction. Started Lamictal on 8/13 or 8/14. Last dose was Monday, 8/17.   Developed a rash and reports that she noticed some mild rash on the evening of 10/07/18 on jut her stomach. In the early am on 8/18 it then started to spread to back, shoulders, and under arm area. Reports that she has had some itching. Denies burning. One or 2 possible small blisters on stomach.   Denies any worsening in rash since stopping Lamictal. Reports that she is not as itchy. She reports that she does not feel as itchy today. Eyes have been slightly itchy without any redness or irritation. Denies any rash around mucus membranes. Denies any respiratory s/s.   Has taken Benadryl 50 mg x 2 yesterday.   She denies any changes in mood and anxiety s/s compared to one week ago. Depression remains chief complaint. Continued low energy and low motivation. Reports that she slept ok last night and sleep has been about the same. Continues to have occ nausea and decreased appetite. Ate one meal. Concentration remains impaired. Anxiety has been higher over the last 24 hours- "I feel like I have tried everything and nothing works." Also had anxiety last week in anticipation of starting a new medication. Felt the start of panic s/s. Reports experiencing some vague thoughts about disregarding safety.  Denies SI.   Past Psychiatric Medication Trials: Wellbutrin- Initially effective and then no longer as effective. May be causing some nausea.  Buspar- Minimally effective.  Zoloft- Nausea.  Citalopram- Intense tachycardia and increased heart rate. Prozac- Lost 20 lbs in a short period of time due to nausea and  loss of appetite. Went a week without sleeping, impulsive purchases and piercing's. Cymbalta- Ineffective. Felt "numb." Pristiq- did not like.  Lamictal- rash  Review of Systems:  Review of Systems  Musculoskeletal: Negative for gait problem.  Skin: Positive for rash.  Psychiatric/Behavioral:       Please refer to HPI    Medications: I have reviewed the patient's current medications.  Current Outpatient Medications  Medication Sig Dispense Refill  . drospirenone-ethinyl estradiol (LORYNA) 3-0.02 MG tablet Take 1 tablet by mouth daily.    [name] lurasidone (LATUDA) 20 MG TABS tablet Take 1/2 tab po qd with a meal for one week, then increase to 1 tab po qd with a meal. 28 tablet 0   No current facility-administered medications for this visit.     Medication Side Effects: Other: Rash  Allergies:  Allergies  Allergen Reactions  . Lamotrigine   . Amoxicillin Rash    Past Medical History:  Diagnosis Date  . Asthma, childhood, exercise-induced   . Depression     Family History  Problem Relation Age of Onset  . Heart attack Maternal Grandfather   . Hyperlipidemia Maternal Grandfather   . Mental illness Maternal Grandfather   . Schizophrenia Maternal Grandfather   . Mood Disorder Maternal Uncle     Social History   Socioeconomic History  . Marital status: Single    Spouse name: Not on file  . Number of children: Not on file  . Years of education: Not on file  . Highest education level: Bachelor's  degree (e.g., BA, AB, BS)  Occupational History  . Occupation: Teacher, adult educationN    Employer: [employer]    Comment: LABOR AND DELIVERY  Social Needs  . Financial resource strain: Not on file  . Food insecurity    Worry: Not on file    Inability: Not on file  . Transportation needs    Medical: Not on file    Non-medical: Not on file  Tobacco Use  . Smoking status: Never Smoker  . Smokeless tobacco: Never Used  Substance and Sexual Activity  . Alcohol use: Yes    Comment: OCC  .  Drug use: Never  . Sexual activity: Yes    Partners: Female, Female    Birth control/protection: I.U.D.  Lifestyle  . Physical activity    Days per week: Not on file    Minutes per session: Not on file  . Stress: Not on file  Relationships  . Social Musicianconnections    Talks on phone: Not on file    Gets together: Not on file    Attends religious service: Not on file    Active member of club or organization: Not on file    Attends meetings of clubs or organizations: Not on file    Relationship status: Not on file  . Intimate partner violence    Fear of current or ex partner: Not on file    Emotionally abused: Not on file    Physically abused: Not on file    Forced sexual activity: Not on file  Other Topics Concern  . Not on file  Social History Narrative  . Not on file    Past Medical History, Surgical history, Social history, and Family history were reviewed and updated as appropriate.   Please see review of systems for further details on the patient's review from today.   Objective:   Physical Exam:  Wt 117 lb (53.1 kg)   BMI 19.47 kg/m   Physical Exam Constitutional:      General: She is not in acute distress.    Appearance: She is well-developed.  Musculoskeletal:        General: No deformity.  Skin:    Findings: Rash present. Rash is urticarial.     Comments: Rash noted on back, neck, and right under arm  Neurological:     Mental Status: She is alert and oriented to person, place, and time.     Coordination: Coordination normal.  Psychiatric:        Attention and Perception: Attention and perception normal. She does not perceive auditory or visual hallucinations.        Mood and Affect: Mood is anxious and depressed. Affect is not labile, blunt, angry or inappropriate.        Speech: Speech normal.        Behavior: Behavior normal.        Thought Content: Thought content normal. Thought content does not include homicidal or suicidal ideation. Thought content does  not include homicidal or suicidal plan.        Cognition and Memory: Cognition and memory normal.        Judgment: Judgment normal.     Comments: Insight intact. No delusions.      Lab Review:     Component Value Date/Time   NA 139 01/08/2018 1341   K 4.0 01/08/2018 1341   CL 106 01/08/2018 1341   CO2 25 01/08/2018 1341   GLUCOSE 93 01/08/2018 1341   BUN 7 01/08/2018 1341  CREATININE 0.72 01/08/2018 1341   CALCIUM 9.4 01/08/2018 1341   PROT 6.9 01/08/2018 1341   ALBUMIN 4.5 01/08/2018 1341   AST 16 01/08/2018 1341   ALT 13 01/08/2018 1341   ALKPHOS 77 01/08/2018 1341   BILITOT 0.5 01/08/2018 1341       Component Value Date/Time   WBC 5.0 01/08/2018 1341   RBC 4.29 01/08/2018 1341   HGB 12.6 01/08/2018 1341   HCT 38.0 01/08/2018 1341   PLT 212.0 01/08/2018 1341   MCV 88.7 01/08/2018 1341   MCHC 33.0 01/08/2018 1341   RDW 13.3 01/08/2018 1341   LYMPHSABS 2.3 01/08/2018 1341   MONOABS 0.3 01/08/2018 1341   EOSABS 0.1 01/08/2018 1341   BASOSABS 0.0 01/08/2018 1341    No results found for: POCLITH, LITHIUM   No results found for: PHENYTOIN, PHENOBARB, VALPROATE, CBMZ   .res Assessment: Plan:   Case staffed with Dr. Jennelle Humanottle. Recommend continuing Benadryl as needed for itching and discussed that she may also start Zyrtec 10 mg at bedtime to improve rash and itching.  Instructed not to restart Lamictal and that Lamictal will be added as an allergy.  Advised patient to contact office if she has any worsening or new allergic signs and symptoms.  Recommend waiting at least 1-2 more days until rash and allergic signs and symptoms have significantly improved and almost resolved before starting any new medications. Discussed potential benefits, risks, and side effects of Latuda.Discussed potential metabolic side effects associated with atypical antipsychotics, as well as potential risk for movement side effects. Advised pt to contact office if movement side effects occur.  Will  start Latuda 20 mg one half tab p.o. daily with a meal for 1 week, then increase to 20 mg 1 tablet daily with a meal to improve mood signs and symptoms.  Discussed that Kasandra KnudsenLatuda is not approved for anxiety, however Latuda may be helpful with addressing anxiety signs and symptoms. Strongly emphasized taking Latuda with a meal to minimize risk of GI side effects and to improve absorption.  Patient advised to contact office if she experiences significant nausea. Patient advised to contact office with any questions, adverse effects, or acute worsening in signs and symptoms. Patient to follow-up in 3 weeks or sooner if clinically indicated. [name] was seen today for medication reaction.  Diagnoses and all orders for this visit:  Episodic mood disorder (HCC) -     lurasidone (LATUDA) 20 MG TABS tablet; Take 1/2 tab po qd with a meal for one week, then increase to 1 tab po qd with a meal.  Other orders -     Discontinue: lurasidone (LATUDA) 20 MG TABS tablet; Take 1/2 tab po qd with a meal for one week, then increase to 1 tab po qd with a meal.     Please see After Visit Summary for patient specific instructions.  Future Appointments  Date Time Provider Department Center  10/24/2018  2:00 PM Genia DelLavoie, Marie-Lyne, MD [facility]  10/31/2018  9:30 AM Corie Chiquitoarter, Liandra Mendia, PMHNP [facility] None    No orders of the defined types were placed in this encounter.   -------------------------------

## 2018-10-10 MED ORDER — LATUDA 20 MG PO TABS: ORAL_TABLET | ORAL | 0 refills | Status: DC

## 2018-10-24 ENCOUNTER — Ambulatory Visit: Payer: No Typology Code available for payment source | Admitting: Obstetrics & Gynecology

## 2018-10-25 MED FILL — DROSPIR-ETH ESTRA 3/.02 MG: 3-0.02 | 84 days supply | Qty: 84 | Fill #1

## 2018-10-31 ENCOUNTER — Encounter: Payer: Self-pay | Admitting: Psychiatry

## 2018-10-31 ENCOUNTER — Ambulatory Visit (INDEPENDENT_AMBULATORY_CARE_PROVIDER_SITE_OTHER): Payer: No Typology Code available for payment source | Admitting: Psychiatry

## 2018-10-31 ENCOUNTER — Other Ambulatory Visit: Payer: Self-pay

## 2018-10-31 DIAGNOSIS — F39 Unspecified mood [affective] disorder: Secondary | ICD-10-CM

## 2018-10-31 MED ORDER — LURASIDONE HCL 40 MG PO TABS: 40.0000 mg | ORAL_TABLET | Freq: Every day | ORAL | 0 refills | Status: DC

## 2018-10-31 MED ORDER — LATUDA 20 MG PO TABS: ORAL_TABLET | ORAL | 0 refills | Status: DC

## 2018-10-31 NOTE — Progress Notes (Signed)
Sylvia Mathews [id number] 05/23/1995 22 y.o.  Subjective:   Patient ID:  Sylvia Mathews is a 23 y.o. (DOB [birth year]) female.  Chief Complaint:  Chief Complaint  Patient presents with  . Depression  . Anxiety    HPI Sylvia Mathews presents to the office today for follow-up of mood and anxiety.   "I don't feel like it has really done anything" in regards to [place]. She has been taking Latuda at her bedtime, however this time changes based on whether she is working or not. Longest period of not taking Latuda is about 36-48 hours.   Reports that she has been sleeping better than normal. She continues to feel tired frequently.   Describes mood as "not great." Reports that there has been some slight improvement in feeling down. Continues to have persistent sad mood. Denies any increased irritability. Anxiety has been high and thinks this may be related to returning to school. Has had some heart racing and chest tightness. Reports one panic attack about 2-3 weeks ago. Motivation has been low "but I still get everything done" and usually at the last minute. Appetite has been somewhat better. Still not eating 3 full meals. Concentration impairment and is unsure if there has been a change. Denies SI or passive death wishes.   Denies any recent manic or hypomanic s/s.   Reports that rash was slow to resolve after stopping Lamictal.   Has started master's program in women studies and gender related studies.   Past Psychiatric Medication Trials: Wellbutrin- Initially effective and then no longer as effective. May be causing some nausea.  Buspar- Minimally effective.  Zoloft- Nausea.  Citalopram- Intense tachycardia and increased heart rate. Prozac- Lost 20 lbs in a short period of time due to nausea and loss of appetite. Went a week without sleeping, impulsive purchases and piercing's. Cymbalta- Ineffective. Felt "numb." Pristiq- did not like. Lamictal-  rash  Review of Systems:  Review of Systems  Gastrointestinal: Positive for nausea.  Musculoskeletal: Negative for gait problem.  Neurological: Negative for tremors.  Psychiatric/Behavioral:       Please refer to HPI    Medications: I have reviewed the patient's current medications.  Current Outpatient Medications  Medication Sig Dispense Refill  . drospirenone-ethinyl estradiol (LORYNA) 3-0.02 MG tablet Take 1 tablet by mouth daily.    [name] lurasidone (LATUDA) 20 MG TABS tablet Take 1/2 tab po qd with a meal for one week, then increase to 1 tab po qd with a meal. 28 tablet 0  . lurasidone (LATUDA) 40 MG TABS tablet Take 1 tablet (40 mg total) by mouth daily with breakfast. 30 tablet 0   No current facility-administered medications for this visit.     Medication Side Effects: None  Allergies:  Allergies  Allergen Reactions  . Lamotrigine   . Amoxicillin Rash    Past Medical History:  Diagnosis Date  . Asthma, childhood, exercise-induced   . Depression     Family History  Problem Relation Age of Onset  . Heart attack Maternal Grandfather   . Hyperlipidemia Maternal Grandfather   . Mental illness Maternal Grandfather   . Schizophrenia Maternal Grandfather   . Mood Disorder Maternal Uncle     Social History   Socioeconomic History  . Marital status: Single    Spouse name: Not on file  . Number of children: Not on file  . Years of education: Not on file  . Highest education level: Bachelor's degree (e.g., BA, AB, BS)  Occupational  History  . Occupation: Teacher, adult educationN    Employer: [employer]    Comment: LABOR AND DELIVERY  Social Needs  . Financial resource strain: Not on file  . Food insecurity    Worry: Not on file    Inability: Not on file  . Transportation needs    Medical: Not on file    Non-medical: Not on file  Tobacco Use  . Smoking status: Never Smoker  . Smokeless tobacco: Never Used  Substance and Sexual Activity  . Alcohol use: Yes    Comment: OCC  .  Drug use: Never  . Sexual activity: Yes    Partners: Female, Female    Birth control/protection: I.U.D.  Lifestyle  . Physical activity    Days per week: Not on file    Minutes per session: Not on file  . Stress: Not on file  Relationships  . Social Musicianconnections    Talks on phone: Not on file    Gets together: Not on file    Attends religious service: Not on file    Active member of club or organization: Not on file    Attends meetings of clubs or organizations: Not on file    Relationship status: Not on file  . Intimate partner violence    Fear of current or ex partner: Not on file    Emotionally abused: Not on file    Physically abused: Not on file    Forced sexual activity: Not on file  Other Topics Concern  . Not on file  Social History Narrative  . Not on file    Past Medical History, Surgical history, Social history, and Family history were reviewed and updated as appropriate.   Please see review of systems for further details on the patient's review from today.   Objective:   Physical Exam:  There were no vitals taken for this visit.  Physical Exam Constitutional:      General: She is not in acute distress.    Appearance: She is well-developed.  Musculoskeletal:        General: No deformity.  Neurological:     Mental Status: She is alert and oriented to person, place, and time.     Coordination: Coordination normal.  Psychiatric:        Attention and Perception: Attention and perception normal. She does not perceive auditory or visual hallucinations.        Mood and Affect: Affect is not labile, blunt, angry or inappropriate.        Speech: Speech normal.        Behavior: Behavior normal.        Thought Content: Thought content normal. Thought content does not include homicidal or suicidal ideation. Thought content does not include homicidal or suicidal plan.        Cognition and Memory: Cognition and memory normal.        Judgment: Judgment normal.      Comments: Pt presents as less depressed and less anxious compared to last exam.  Insight intact. No delusions.      Lab Review:     Component Value Date/Time   NA 139 01/08/2018 1341   K 4.0 01/08/2018 1341   CL 106 01/08/2018 1341   CO2 25 01/08/2018 1341   GLUCOSE 93 01/08/2018 1341   BUN 7 01/08/2018 1341   CREATININE 0.72 01/08/2018 1341   CALCIUM 9.4 01/08/2018 1341   PROT 6.9 01/08/2018 1341   ALBUMIN 4.5 01/08/2018 1341   AST 16  01/08/2018 1341   ALT 13 01/08/2018 1341   ALKPHOS 77 01/08/2018 1341   BILITOT 0.5 01/08/2018 1341       Component Value Date/Time   WBC 5.0 01/08/2018 1341   RBC 4.29 01/08/2018 1341   HGB 12.6 01/08/2018 1341   HCT 38.0 01/08/2018 1341   PLT 212.0 01/08/2018 1341   MCV 88.7 01/08/2018 1341   MCHC 33.0 01/08/2018 1341   RDW 13.3 01/08/2018 1341   LYMPHSABS 2.3 01/08/2018 1341   MONOABS 0.3 01/08/2018 1341   EOSABS 0.1 01/08/2018 1341   BASOSABS 0.0 01/08/2018 1341    No results found for: POCLITH, LITHIUM   No results found for: PHENYTOIN, PHENOBARB, VALPROATE, CBMZ   .res Assessment: Plan:   Pt seen for 30 minutes and greater than 50% of session was spent counseling pt re: strategies to improve tolerability and problem solving re: med administration with alternating between night shift and days. Discussed that she may wish to try dividing doses to minimize length of time between doses when shifting schedules. Recommend increasing Latuda to 30 mg po qd with a meal for one week, then increasing to 40 mg po qd with a meal as tolerated to improve mood. Recommended pt call with any side effects or questions.  Pt to f/u in 3-4 weeks or sooner if clinically indicated. Patient advised to contact office with any questions, adverse effects, or acute worsening in signs and symptoms.  Sylvia Mathews was seen today for depression and anxiety.  Diagnoses and all orders for this visit:  Episodic mood disorder (HCC) -     lurasidone (LATUDA) 20 MG  TABS tablet; Take 1/2 tab po qd with a meal for one week, then increase to 1 tab po qd with a meal.  Other orders -     lurasidone (LATUDA) 40 MG TABS tablet; Take 1 tablet (40 mg total) by mouth daily with breakfast.     Please see After Visit Summary for patient specific instructions.  Future Appointments  Date Time Provider Center  11/27/2018 11:00 AM Thayer Headings, PMHNP [facility] None    No orders of the defined types were placed in this encounter.   -------------------------------

## 2018-11-27 ENCOUNTER — Other Ambulatory Visit: Payer: Self-pay

## 2018-11-27 ENCOUNTER — Ambulatory Visit (INDEPENDENT_AMBULATORY_CARE_PROVIDER_SITE_OTHER): Payer: No Typology Code available for payment source | Admitting: Psychiatry

## 2018-11-27 ENCOUNTER — Encounter: Payer: Self-pay | Admitting: Psychiatry

## 2018-11-27 VITALS — BP 113/89 | HR 87

## 2018-11-27 DIAGNOSIS — F4010 Social phobia, unspecified: Secondary | ICD-10-CM

## 2018-11-27 DIAGNOSIS — F39 Unspecified mood [affective] disorder: Secondary | ICD-10-CM | POA: Diagnosis not present

## 2018-11-27 DIAGNOSIS — F411 Generalized anxiety disorder: Secondary | ICD-10-CM | POA: Diagnosis not present

## 2018-11-27 MED ORDER — BUPROPION HCL ER (XL) 150 MG PO TB24: 150.0000 mg | ORAL_TABLET | Freq: Every day | ORAL | 1 refills | Status: DC

## 2018-11-27 MED ORDER — BUSPIRONE HCL 15 MG PO TABS: ORAL_TABLET | ORAL | 1 refills | Status: DC

## 2018-11-27 MED ORDER — PROPRANOLOL HCL 10 MG PO TABS: ORAL_TABLET | ORAL | 1 refills | Status: DC

## 2018-11-27 MED FILL — busPIRone HCL 15 MG TABS: 15 | 30 days supply | Qty: 60 | Fill #0

## 2018-11-27 MED FILL — PROPRANOLOL HCL 10 MG TAB: 10 | 30 days supply | Qty: 120 | Fill #0

## 2018-11-27 MED FILL — buPROPion HCL ER (XL) 150 M: 150 | 30 days supply | Qty: 30 | Fill #0

## 2018-11-27 NOTE — Progress Notes (Signed)
Sylvia Mathews [id number] Jan 23, 1996 22 y.o.  Subjective:   Patient ID:  Sylvia PorterBailey Mathews Mathews is a 23 y.o. (DOB Jan 23, 1996) female.  Chief Complaint:  Chief Complaint  Patient presents with  . Depression  . Anxiety    HPI Sylvia PigeonBailey Mathews Mathews presents to the office today for follow-up of mood and anxiety. She reports that her mood has been depressed. Energy and motivation have been very low. Reports some feelings of excessive guilt. Appetite has been "non-existent." Reports feeling hungry for 1-2 meals daily. Concentration has been impaired. Limited enjoyment in things. Denies any recent manic s/s although is noticing some increase in energy and motivation over the last 1-2 days. Denies SI. Reports that she had some passive death wishes and vague SI prior to one week ago.   Reports that she had a severe anxiety attack last week prior to presentation in class. Has been experiencing generalized anxiety and worry. Now having anxiety about how depression has effected her school work. Reports long-standing social anxiety since 6817-17 yo. Reports that she was always a shy child.   She reports that she is having fatigue with Latuda 30 mg po qd. Was not able to tolerate taking 40 mg since she would sleep 10-12 hours and continue to feel tired and groggy. She reports that she noticed limited benefit with Latuda. Stopped taking Latuda about one week ago. Feels that she has been sleeping excessively.   Asks about re-starting Wellbutrin XL.   Working full-time and going to school. Continues to see counselor.   Past Psychiatric Medication Trials: Wellbutrin- Initially effective and then no longer as effective. May be causing some nausea.  Buspar- Minimally effective.  Zoloft- Nausea.  Citalopram- Intense tachycardia and increased heart rate. Prozac- Lost 20 lbs in a short period of time due to nausea and loss of appetite. Went a week without sleeping, impulsive purchases and  piercing's. Cymbalta- Ineffective. Felt "numb." Pristiq- did not like. Lamictal- rash Latuda- Severe somnolence.  Review of Systems:  Review of Systems  Constitutional: Positive for fatigue.  Gastrointestinal: Negative.   Musculoskeletal: Negative for gait problem.  Neurological: Negative for tremors and headaches.  Psychiatric/Behavioral:       Please refer to HPI    Medications: I have reviewed the patient's current medications.  Current Outpatient Medications  Medication Sig Dispense Refill  . buPROPion (WELLBUTRIN XL) 150 MG 24 hr tablet Take 1 tablet (150 mg total) by mouth daily. 30 tablet 1  . busPIRone (BUSPAR) 15 MG tablet Take 1/3 tablet p.o. twice daily for 1 week, then take 2/3 tablet p.o. twice daily for 1 week, then take 1 tablet p.o. twice daily 60 tablet 1  . drospirenone-ethinyl estradiol (LORYNA) 3-0.02 MG tablet Take 1 tablet by mouth daily.    . propranolol (INDERAL) 10 MG tablet Take 1-2 tabs po BID prn anxiety 120 tablet 1   No current facility-administered medications for this visit.     Medication Side Effects: Fatigue  Allergies:  Allergies  Allergen Reactions  . Lamotrigine   . Amoxicillin Rash    Past Medical History:  Diagnosis Date  . Asthma, childhood, exercise-induced   . Depression     Family History  Problem Relation Age of Onset  . Heart attack Maternal Grandfather   . Hyperlipidemia Maternal Grandfather   . Mental illness Maternal Grandfather   . Schizophrenia Maternal Grandfather   . Mood Disorder Maternal Uncle     Social History   Socioeconomic History  . Marital status: Single  Spouse name: Not on file  . Number of children: Not on file  . Years of education: Not on file  . Highest education level: Bachelor's degree (e.g., BA, AB, BS)  Occupational History  . Occupation: Programmer, multimedia: [employer]  Social Needs  . Financial resource strain: Not on file  . Food insecurity     Worry: Not on file    Inability: Not on file  . Transportation needs    Medical: Not on file    Non-medical: Not on file  Tobacco Use  . Smoking status: Never Smoker  . Smokeless tobacco: Never Used  Substance and Sexual Activity  . Alcohol use: Yes    Comment: OCC  . Drug use: Never  . Sexual activity: Yes    Partners: Female, Female    Birth control/protection: I.U.D.  Lifestyle  . Physical activity    Days per week: Not on file    Minutes per session: Not on file  . Stress: Not on file  Relationships  . Social Herbalist on phone: Not on file    Gets together: Not on file    Attends religious service: Not on file    Active member of club or organization: Not on file    Attends meetings of clubs or organizations: Not on file    Relationship status: Not on file  . Intimate partner violence    Fear of current or ex partner: Not on file    Emotionally abused: Not on file    Physically abused: Not on file    Forced sexual activity: Not on file  Other Topics Concern  . Not on file  Social History Narrative  . Not on file    Past Medical History, Surgical history, Social history, and Family history were reviewed and updated as appropriate.   Please see review of systems for further details on the patient's review from today.   Objective:   Physical Exam:  BP 113/89   Pulse 87   Physical Exam Constitutional:      General: She is not in acute distress.    Appearance: She is well-developed.  Musculoskeletal:        General: No deformity.  Neurological:     Mental Status: She is alert and oriented to person, place, and time.     Coordination: Coordination normal.  Psychiatric:        Attention and Perception: Attention and perception normal. She does not perceive auditory or visual hallucinations.        Mood and Affect: Mood is anxious and depressed. Affect is blunt. Affect is not labile, angry or inappropriate.        Speech: Speech normal.         Behavior: Behavior normal.        Thought Content: Thought content normal. Thought content is not paranoid or delusional. Thought content does not include homicidal or suicidal ideation. Thought content does not include homicidal or suicidal plan.        Cognition and Memory: Cognition and memory normal.        Judgment: Judgment normal.     Comments: Insight intact. No delusions.      Lab Review:     Component Value Date/Time   NA 139 01/08/2018 1341   K 4.0 01/08/2018 1341   CL 106 01/08/2018 1341   CO2 25 01/08/2018 1341   GLUCOSE 93 01/08/2018 1341  BUN 7 01/08/2018 1341   CREATININE 0.72 01/08/2018 1341   CALCIUM 9.4 01/08/2018 1341   PROT 6.9 01/08/2018 1341   ALBUMIN 4.5 01/08/2018 1341   AST 16 01/08/2018 1341   ALT 13 01/08/2018 1341   ALKPHOS 77 01/08/2018 1341   BILITOT 0.5 01/08/2018 1341       Component Value Date/Time   WBC 5.0 01/08/2018 1341   RBC 4.29 01/08/2018 1341   HGB 12.6 01/08/2018 1341   HCT 38.0 01/08/2018 1341   PLT 212.0 01/08/2018 1341   MCV 88.7 01/08/2018 1341   MCHC 33.0 01/08/2018 1341   RDW 13.3 01/08/2018 1341   LYMPHSABS 2.3 01/08/2018 1341   MONOABS 0.3 01/08/2018 1341   EOSABS 0.1 01/08/2018 1341   BASOSABS 0.0 01/08/2018 1341    No results found for: POCLITH, LITHIUM   No results found for: PHENYTOIN, PHENOBARB, VALPROATE, CBMZ   .res Assessment: Plan:   Patient seen for 30 minutes and greater than 50% of visit spent counseling patient and coordinating care.  Patient reports that she would like to resume Wellbutrin XL and BuSpar since these medications have been the best tolerated medications that she has taken and in the past she has had some initial improvement in depressive signs and symptoms with Wellbutrin and would like to resume Wellbutrin even if she experiences only some transient improvement in depressive signs and symptoms.  Reviewed potential benefits, risks, and side effects of Wellbutrin and BuSpar with patient  and discussed titration of BuSpar to minimize any tolerability issues. Also counseled patient regarding potential benefits of pharmacogenetics testing and reviewed sample report with her.  Patient reported that she would like to proceed with pharmacogenetics testing and saliva sample was obtained at time of exam.  Will discuss results at time of next visit and implications for future treatment options. Patient to follow-up in 4 weeks or sooner if clinically indicated. Patient advised to contact office with any questions, adverse effects, or acute worsening in signs and symptoms.  Shandiin was seen today for depression and anxiety.  Diagnoses and all orders for this visit:  Social anxiety disorder -     busPIRone (BUSPAR) 15 MG tablet; Take 1/3 tablet p.o. twice daily for 1 week, then take 2/3 tablet p.o. twice daily for 1 week, then take 1 tablet p.o. twice daily -     propranolol (INDERAL) 10 MG tablet; Take 1-2 tabs po BID prn anxiety  Episodic mood disorder (HCC) -     buPROPion (WELLBUTRIN XL) 150 MG 24 hr tablet; Take 1 tablet (150 mg total) by mouth daily.  GAD (generalized anxiety disorder) -     busPIRone (BUSPAR) 15 MG tablet; Take 1/3 tablet p.o. twice daily for 1 week, then take 2/3 tablet p.o. twice daily for 1 week, then take 1 tablet p.o. twice daily -     propranolol (INDERAL) 10 MG tablet; Take 1-2 tabs po BID prn anxiety     Please see After Visit Summary for patient specific instructions.  Future Appointments  Date Time Provider Department Center  12/27/2018  9:00 AM Corie Chiquito, PMHNP [facility] None    No orders of the defined types were placed in this encounter.   -------------------------------

## 2018-12-27 ENCOUNTER — Encounter: Payer: Self-pay | Admitting: Psychiatry

## 2018-12-27 ENCOUNTER — Other Ambulatory Visit: Payer: Self-pay

## 2018-12-27 ENCOUNTER — Ambulatory Visit (INDEPENDENT_AMBULATORY_CARE_PROVIDER_SITE_OTHER): Payer: No Typology Code available for payment source | Admitting: Psychiatry

## 2018-12-27 DIAGNOSIS — F39 Unspecified mood [affective] disorder: Secondary | ICD-10-CM

## 2018-12-27 DIAGNOSIS — Z1589 Genetic susceptibility to other disease: Secondary | ICD-10-CM | POA: Diagnosis not present

## 2018-12-27 DIAGNOSIS — F4010 Social phobia, unspecified: Secondary | ICD-10-CM | POA: Diagnosis not present

## 2018-12-27 DIAGNOSIS — F411 Generalized anxiety disorder: Secondary | ICD-10-CM

## 2018-12-27 MED ORDER — DEPLIN 15 15-90.314 MG PO CAPS: 15.0000 mg | ORAL_CAPSULE | Freq: Every day | ORAL | 0 refills | Status: DC

## 2018-12-27 MED ORDER — BUSPIRONE HCL 15 MG PO TABS: 15.0000 mg | ORAL_TABLET | Freq: Two times a day (BID) | ORAL | 0 refills | Status: DC

## 2018-12-27 MED ORDER — BUPROPION HCL ER (XL) 150 MG PO TB24: 150.0000 mg | ORAL_TABLET | Freq: Every day | ORAL | 0 refills | Status: DC

## 2018-12-27 MED FILL — busPIRone HCL 15 MG TABS: 15 | 90 days supply | Qty: 180 | Fill #0

## 2018-12-27 MED FILL — buPROPion HCL ER (XL) 150 M: 150 | 90 days supply | Qty: 90 | Fill #0

## 2018-12-27 NOTE — Progress Notes (Signed)
Sylvia Mathews [id number] Sep 24, 1995 23 y.o.  Subjective:   Patient ID:  Sylvia Mathews is a 23 y.o. (DOB March 31, 1995) female.  Chief Complaint:  Chief Complaint  Patient presents with  . Depression  . Anxiety    HPI [name] presents to the office today for follow-up of mood and anxiety. She reports that she has not noticed as significant of an improvement with Wellbutrin XL as she has in the past but has noticed some slight improvements in mood. Motivation has been very low and has fallen behind in her school work, even when given extensions by professors. Energy has improved slightly but remains low. Reports significant difficulty with concentration.  Has anxiety about letting other people down. Has chest tightness and some shallow breathing with anxiety. Reports social anxiety has improved. Now having more anxiety about the things she is not doing due to depression and low motivation. Some catastrophic thinking. Sleep remains disturbed with working nights. Slight decrease in appetite since starting Wellbutrin XL. Reports that she occasionally has vague suicidal thoughts and "am able to rationalize my way out of it." Denies SI with intent or plan.  Reports that Propranolol prn has been helpful for situational anxiety.   Reports that she has been spending impulsively and this is not a change in her baseline and spends impulsively regardless of mood state.  Has gotten a new job and will be leaving night. 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 She will be working Monday- Friday and regular work hours.    Past Psychiatric Medication Trials: Wellbutrin- Initially effective and then no longer as effective. May be causing some nausea.  Buspar- Minimally effective.  Zoloft- Nausea.  Citalopram- Intense tachycardia and increased heart rate. Prozac- Lost 20 lbs in a short period of time due to nausea and loss of appetite. Went a  week without sleeping, impulsive purchases and piercing's. Cymbalta- Ineffective. Felt "numb." Pristiq- did not like. Lamictal- rash Latuda- Severe somnolence.   Review of Systems:  Review of Systems  HENT: Positive for congestion.   Gastrointestinal:       Has some nausea when working nights.   Musculoskeletal: Negative for gait problem.  Allergic/Immunologic: Positive for environmental allergies.  Neurological: Negative for tremors.  Psychiatric/Behavioral:       Please refer to HPI    Medications: I have reviewed the patient's current medications.  Current Outpatient Medications  Medication Sig Dispense Refill  . buPROPion (WELLBUTRIN XL) 150 MG 24 hr tablet Take 1 tablet (150 mg total) by mouth daily. 90 tablet 0  . busPIRone (BUSPAR) 15 MG tablet Take 1 tablet (15 mg total) by mouth 2 (two) times daily. 180 tablet 0  . drospirenone-ethinyl estradiol (LORYNA) 3-0.02 MG tablet Take 1 tablet by mouth daily.    [name] loratadine (CLARITIN) 10 MG tablet Take 10 mg by mouth daily.    . propranolol (INDERAL) 10 MG tablet Take 1-2 tabs po BID prn anxiety 120 tablet 1  . L-Methylfolate-Algae (DEPLIN 15) 15-90.314 MG CAPS Take 15 mg by mouth daily. 30 capsule 0   No current facility-administered medications for this visit.     Medication Side Effects: Other: Slight decrease in appetite, some slight increase in hands shaking  Allergies:  Allergies  Allergen Reactions  . Lamotrigine   . Amoxicillin Rash    Past Medical History:  Diagnosis Date  . Asthma, childhood, exercise-induced   . Depression     Family History  Problem Relation  Age of Onset  . Heart attack Maternal Grandfather   . Hyperlipidemia Maternal Grandfather   . Mental illness Maternal Grandfather   . Schizophrenia Maternal Grandfather   . Mood Disorder Maternal Uncle     Social History   Socioeconomic History  . Marital status: Single    Spouse name: Not on file  . Number of children: Not on file  .  Years of education: Not on file  . Highest education level: Bachelor's degree (e.g., BA, AB, BS)  Occupational History  . Occupation: Teacher, adult educationN    Employer: [employer]    Comment: LABOR AND DELIVERY  Social Needs  . Financial resource strain: Not on file  . Food insecurity    Worry: Not on file    Inability: Not on file  . Transportation needs    Medical: Not on file    Non-medical: Not on file  Tobacco Use  . Smoking status: Never Smoker  . Smokeless tobacco: Never Used  Substance and Sexual Activity  . Alcohol use: Yes    Comment: OCC  . Drug use: Never  . Sexual activity: Yes    Partners: Female, Female    Birth control/protection: I.U.D.  Lifestyle  . Physical activity    Days per week: Not on file    Minutes per session: Not on file  . Stress: Not on file  Relationships  . Social Musicianconnections    Talks on phone: Not on file    Gets together: Not on file    Attends religious service: Not on file    Active member of club or organization: Not on file    Attends meetings of clubs or organizations: Not on file    Relationship status: Not on file  . Intimate partner violence    Fear of current or ex partner: Not on file    Emotionally abused: Not on file    Physically abused: Not on file    Forced sexual activity: Not on file  Other Topics Concern  . Not on file  Social History Narrative  . Not on file    Past Medical History, Surgical history, Social history, and Family history were reviewed and updated as appropriate.   Please see review of systems for further details on the patient's review from today.   Objective:   Physical Exam:  There were no vitals taken for this visit.  Physical Exam Constitutional:      General: She is not in acute distress.    Appearance: She is well-developed.  Musculoskeletal:        General: No deformity.  Neurological:     Mental Status: She is alert and oriented to person, place, and time.     Coordination: Coordination normal.   Psychiatric:        Attention and Perception: Attention and perception normal. She does not perceive auditory or visual hallucinations.        Mood and Affect: Mood is anxious and depressed. Affect is not labile, blunt, angry or inappropriate.        Speech: Speech normal.        Behavior: Behavior normal. Behavior is cooperative.        Thought Content: Thought content normal. Thought content is not paranoid or delusional. Thought content does not include homicidal or suicidal ideation. Thought content does not include homicidal or suicidal plan.        Cognition and Memory: Cognition and memory normal.        Judgment:  Judgment normal.     Comments: Insight intact. No delusions.      Lab Review:     Component Value Date/Time   NA 139 01/08/2018 1341   K 4.0 01/08/2018 1341   CL 106 01/08/2018 1341   CO2 25 01/08/2018 1341   GLUCOSE 93 01/08/2018 1341   BUN 7 01/08/2018 1341   CREATININE 0.72 01/08/2018 1341   CALCIUM 9.4 01/08/2018 1341   PROT 6.9 01/08/2018 1341   ALBUMIN 4.5 01/08/2018 1341   AST 16 01/08/2018 1341   ALT 13 01/08/2018 1341   ALKPHOS 77 01/08/2018 1341   BILITOT 0.5 01/08/2018 1341       Component Value Date/Time   WBC 5.0 01/08/2018 1341   RBC 4.29 01/08/2018 1341   HGB 12.6 01/08/2018 1341   HCT 38.0 01/08/2018 1341   PLT 212.0 01/08/2018 1341   MCV 88.7 01/08/2018 1341   MCHC 33.0 01/08/2018 1341   RDW 13.3 01/08/2018 1341   LYMPHSABS 2.3 01/08/2018 1341   MONOABS 0.3 01/08/2018 1341   EOSABS 0.1 01/08/2018 1341   BASOSABS 0.0 01/08/2018 1341    No results found for: POCLITH, LITHIUM   No results found for: PHENYTOIN, PHENOBARB, VALPROATE, CBMZ   .res Assessment: Plan:   Patient seen for 30 minutes and greater than 50% of visit spent counseling patient regarding results of psychopharmacological testing and discussing implications for treatment.  Discussed that testing indicated that she is heterozygous for MTHFR mutation and therefore may  benefit from [place].  Answered patient's questions regarding MTHFR mutation and potential benefits, risks, and side effects of Deplin.  Patient agrees to trial of Deplin.  Patient provided with samples of Deplin 15 mg to start 1 capsule daily. Discussed increasing Wellbutrin XL to 300 mg daily once response to Deplin is determined and advised patient that she could contact office if she needs prescription for 300 mg tablets. Will continue BuSpar 15 mg twice daily for anxiety. Discussed follow-up in 4 weeks, however patient reports that she will be transitioning jobs at that time and is unsure if she may have a brief lapse in insurance coverage.  Patient reports that she will contact current and future employer to determine coverage dates and will then contact office to schedule an appointment based on this information. Patient advised to contact office with any questions, adverse effects, or acute worsening in signs and symptoms.  Sylvia Mathews was seen today for depression and anxiety.  Diagnoses and all orders for this visit:  Episodic mood disorder (HCC) -     buPROPion (WELLBUTRIN XL) 150 MG 24 hr tablet; Take 1 tablet (150 mg total) by mouth daily. -     L-Methylfolate-Algae (DEPLIN 15) 15-90.314 MG CAPS; Take 15 mg by mouth daily.  Social anxiety disorder -     busPIRone (BUSPAR) 15 MG tablet; Take 1 tablet (15 mg total) by mouth 2 (two) times daily.  GAD (generalized anxiety disorder) -     busPIRone (BUSPAR) 15 MG tablet; Take 1 tablet (15 mg total) by mouth 2 (two) times daily.  Heterozygous for MTHFR gene mutation -     L-Methylfolate-Algae (DEPLIN 15) 15-90.314 MG CAPS; Take 15 mg by mouth daily.     Please see After Visit Summary for patient specific instructions.  No future appointments.  No orders of the defined types were placed in this encounter.   -------------------------------

## 2019-01-22 MED FILL — DROSPIR-ETH ESTRA 3/.02 MG: 3-0.02 | 28 days supply | Qty: 28 | Fill #2

## 2019-02-11 ENCOUNTER — Other Ambulatory Visit: Payer: No Typology Code available for payment source

## 2019-02-12 ENCOUNTER — Other Ambulatory Visit: Payer: No Typology Code available for payment source

## 2019-02-24 MED FILL — DROSPIR-ETH ESTRA 3/.02 MG: 3-0.02 | 28 days supply | Qty: 28 | Fill #3

## 2019-03-10 ENCOUNTER — Ambulatory Visit: Payer: No Typology Code available for payment source | Admitting: Physician Assistant

## 2019-03-26 ENCOUNTER — Other Ambulatory Visit: Payer: Self-pay

## 2019-03-27 ENCOUNTER — Ambulatory Visit (INDEPENDENT_AMBULATORY_CARE_PROVIDER_SITE_OTHER): Payer: 59 | Admitting: Physician Assistant

## 2019-03-27 ENCOUNTER — Encounter: Payer: Self-pay | Admitting: Physician Assistant

## 2019-03-27 VITALS — BP 100/70 | HR 83 | Temp 98.2°F | Ht 65.0 in | Wt 118.2 lb

## 2019-03-27 DIAGNOSIS — E538 Deficiency of other specified B group vitamins: Secondary | ICD-10-CM

## 2019-03-27 DIAGNOSIS — W19XXXA Unspecified fall, initial encounter: Secondary | ICD-10-CM

## 2019-03-27 DIAGNOSIS — E559 Vitamin D deficiency, unspecified: Secondary | ICD-10-CM

## 2019-03-27 DIAGNOSIS — R5383 Other fatigue: Secondary | ICD-10-CM

## 2019-03-27 LAB — COMPREHENSIVE METABOLIC PANEL
ALT: 11 U/L (ref 0–35)
AST: 14 U/L (ref 0–37)
Albumin: 4.1 g/dL (ref 3.5–5.2)
Alkaline Phosphatase: 70 U/L (ref 39–117)
BUN: 9 mg/dL (ref 6–23)
CO2: 23 mEq/L (ref 19–32)
Calcium: 9.3 mg/dL (ref 8.4–10.5)
Chloride: 106 mEq/L (ref 96–112)
Creatinine, Ser: 0.69 mg/dL (ref 0.40–1.20)
GFR: 104.42 mL/min (ref >60.00)
Glucose, Bld: 88 mg/dL (ref 70–99)
Potassium: 4.4 mEq/L (ref 3.5–5.1)
Sodium: 136 mEq/L (ref 135–145)
Total Bilirubin: 0.4 mg/dL (ref 0.2–1.2)
Total Protein: 6.9 g/dL (ref 6.0–8.3)

## 2019-03-27 LAB — CBC WITH DIFFERENTIAL/PLATELET
Basophils Absolute: 0.1 10*3/uL (ref 0.0–0.1)
Basophils Relative: 1.2 % (ref 0.0–3.0)
Eosinophils Absolute: 0.1 10*3/uL (ref 0.0–0.7)
Eosinophils Relative: 1.5 % (ref 0.0–5.0)
HCT: 38.5 % (ref 36.0–46.0)
Hemoglobin: 12.7 g/dL (ref 12.0–15.0)
Lymphocytes Relative: 53.1 % — ABNORMAL HIGH (ref 12.0–46.0)
Lymphs Abs: 2.2 10*3/uL (ref 0.7–4.0)
MCHC: 32.9 g/dL (ref 30.0–36.0)
MCV: 88 fl (ref 78.0–100.0)
Monocytes Absolute: 0.3 10*3/uL (ref 0.1–1.0)
Monocytes Relative: 6.2 % (ref 3.0–12.0)
Neutro Abs: 1.6 10*3/uL (ref 1.4–7.7)
Neutrophils Relative %: 38 % — ABNORMAL LOW (ref 43.0–77.0)
Platelets: 225 10*3/uL (ref 150.0–400.0)
RBC: 4.38 Mil/uL (ref 3.87–5.11)
RDW: 12.9 % (ref 11.5–15.5)
WBC: 4.2 10*3/uL (ref 4.0–10.5)

## 2019-03-27 LAB — POCT URINE PREGNANCY: Preg Test, Ur: NEGATIVE

## 2019-03-27 LAB — TSH: TSH: 0.83 u[IU]/mL (ref 0.35–4.50)

## 2019-03-27 LAB — VITAMIN B12: Vitamin B-12: 112 pg/mL — ABNORMAL LOW (ref 211–911)

## 2019-03-27 LAB — VITAMIN D 25 HYDROXY (VIT D DEFICIENCY, FRACTURES): VITD: 19.3 ng/mL — ABNORMAL LOW (ref 30.00–100.00)

## 2019-03-27 NOTE — Patient Instructions (Signed)
It was great to see you! ° °I will be in touch with your lab results and the plan. ° °Take care, ° °Ashleigh Luckow PA-C ° °

## 2019-03-27 NOTE — Progress Notes (Signed)
Sylvia Mathews is a 24 y.o. female is here to discuss: Falls and transfer of care.  I acted as a Education administrator for [employer], PA-C Anselmo Pickler, LPN  History of Present Illness:   Chief Complaint  Patient presents with  . Transfer of care  . Fall    HPI   Pt is here for transfer of care today from Dr. Juleen China.  Depression Off of all medications in November under direction of her psychiatrist. Has been of of these medications before and tolerated without side effects.  Fall Pt c/o falling x 2 times in the past month. Both times were around 5-6 pm, and she had eaten lunch before. Both times she was just walking and then the next thing she knew she was on the ground. Also c/o numbness and tingling in feet and headaches. Does have known hx of Vit D and B12 deficiency. Fatigue, brain fog, unable to focus. Has concerns of possibly having ADHD. Takes a multivitamin most days, no additional B12 supplement. She is vegetarian. Denies: chest pains, palpitations, SOB, vision changes, lightheadedness, dizziness, n/v/d.  Difficulty concentrating and focusing. No personal history of ADHD.  No intentional weight loss.  Periods are 3-4 days, not heavy.  Rare alcohol use.   Coincidentally, patient did receive first Moderna COVID-19 vaccine prior to onset of falls.  Patient's last menstrual period was 03/24/2019.   Wt Readings from Last 5 Encounters:  03/27/19 118 lb 4 oz (53.6 kg)  07/12/18 115 lb (52.2 kg)  04/05/18 112 lb (50.8 kg)  01/08/18 120 lb 3.2 oz (54.5 kg)     Health Maintenance Due  Topic Date Due  . PAP-Cervical Cytology Screening  02/11/2016  . PAP SMEAR-Modifier  02/11/2016  . TETANUS/TDAP  07/16/2016    Past Medical History:  Diagnosis Date  . Asthma, childhood, exercise-induced   . Depression      Social History   Socioeconomic History  . Marital status: Single    Spouse name: Not on file  . Number of children: Not on file  . Years of  education: Not on file  . Highest education level: Bachelor's degree (e.g., BA, AB, BS)  Occupational History  . 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 Smoking status: Never Smoker  . Smokeless tobacco: Never Used  Substance and Sexual Activity  . Alcohol use: Yes    Comment: OCC  . Drug use: Never  . Sexual activity: Yes    Partners: Female, Female    Birth control/protection: I.U.D.  Other Topics Concern  . Not on file  Social History Narrative  . Not on file   Social Determinants of Health   Financial Resource Strain:   . Difficulty of Paying Living Expenses: Not on file  Food Insecurity:   . Worried About Charity fundraiser in the Last Year: Not on file  . Ran Out of Food in the Last Year: Not on file  Transportation Needs:   . Lack of Transportation (Medical): Not on file  . Lack of Transportation (Non-Medical): Not on file  Physical Activity:   . Days of Exercise per Week: Not on file  . Minutes of Exercise per Session: Not on file  Stress:   . Feeling of Stress : Not on file  Social Connections:   . Frequency of Communication with Friends and Family: Not on file  . Frequency of Social Gatherings with Friends and Family:  Not on file  . Attends Religious Services: Not on file  . Active Member of Clubs or Organizations: Not on file  . Attends Banker Meetings: Not on file  . Marital Status: Not on file  Intimate Partner Violence:   . Fear of Current or Ex-Partner: Not on file  . Emotionally Abused: Not on file  . Physically Abused: Not on file  . Sexually Abused: Not on file    History reviewed. No pertinent surgical history.  Family History  Problem Relation Age of Onset  . Hypothyroidism Mother   . Parkinson's disease Maternal Grandmother   . Heart attack Maternal Grandfather   . Hyperlipidemia Maternal Grandfather   . Mental illness Maternal Grandfather   . Schizophrenia Maternal Grandfather    . Heart disease Maternal Grandfather   . Alcohol abuse Paternal Grandfather   . Mood Disorder Maternal Uncle   . Bipolar disorder Maternal Uncle     PMHx, SurgHx, SocialHx, FamHx, Medications, and Allergies were reviewed in the Visit Navigator and updated as appropriate.   Patient Active Problem List   Diagnosis Date Noted  . Vegetarian 01/09/2018  . Vitamin D deficiency 01/09/2018  . B12 deficiency 01/09/2018  . Menorrhagia 11/28/2012    Social History   Tobacco Use  . Smoking status: Never Smoker  . Smokeless tobacco: Never Used  Substance Use Topics  . Alcohol use: Yes    Comment: OCC  . Drug use: Never    Current Medications and Allergies:    Current Outpatient Medications:  .  drospirenone-ethinyl estradiol (LORYNA) 3-0.02 MG tablet, Take 1 tablet by mouth daily., Disp: , Rfl:  .  L-Methylfolate-Algae (DEPLIN 15) 15-90.314 MG CAPS, Take 15 mg by mouth daily., Disp: 30 capsule, Rfl: 0   Allergies  Allergen Reactions  . Lamotrigine   . Amoxicillin Rash    Review of Systems   ROS  Negative unless otherwise specified per HPI.  Vitals:   Vitals:   03/27/19 0822  BP: 100/70  Pulse: 83  Temp: 98.2 F (36.8 C)  TempSrc: Temporal  SpO2: 98%  Weight: 118 lb 4 oz (53.6 kg)  Height: 5\' 5"  (1.651 m)     Body mass index is 19.68 kg/m.   Physical Exam:    Physical Exam Vitals and nursing note reviewed.  Constitutional:      General: She is not in acute distress.    Appearance: She is well-developed. She is not ill-appearing or toxic-appearing.  Cardiovascular:     Rate and Rhythm: Normal rate and regular rhythm.     Pulses: Normal pulses.     Heart sounds: Normal heart sounds, S1 normal and S2 normal.     Comments: No LE edema Pulmonary:     Effort: Pulmonary effort is normal.     Breath sounds: Normal breath sounds.  Skin:    General: Skin is warm and dry.  Neurological:     General: No focal deficit present.     Mental Status: She is alert.       GCS: GCS eye subscore is 4. GCS verbal subscore is 5. GCS motor subscore is 6.     Cranial Nerves: Cranial nerves are intact.     Sensory: Sensation is intact.     Motor: Motor function is intact.     Coordination: Coordination is intact. Romberg sign negative.     Gait: Gait is intact.  Psychiatric:        Speech: Speech normal.  Behavior: Behavior normal. Behavior is cooperative.    Results for orders placed or performed in visit on 03/27/19  POCT urine pregnancy  Result Value Ref Range   Preg Test, Ur Negative Negative     Assessment and Plan:    Sylvia Mathews was seen today for transfer of care and fall.  Diagnoses and all orders for this visit:  Fall, initial encounter; Fatigue, unspecified type Unclear etiology. Will update labs, possibly has low B12. If labs unrevealing, will send to neuro for further evaluation. No symptoms suggesting cardiac or pulmonary involvement. Discussed that if symptoms change, to notify us immediately. -     CBC with Differential/Platelet -     Comprehensive metabolic panel -     CBC with Differential/Platelet -     Comprehensive metabolic panel -     TSH -     Iron, TIBC and Ferritin Panel -     POCT urine pregnancy  B12 deficiency -     Vitamin B12  Vitamin D deficiency -     VITAMIN D 25 Hydroxy (Vit-D Deficiency, Fractures)  . Reviewed expectations re: course of current medical issues. . Discussed self-management of symptoms. . Outlined signs and symptoms indicating need for more acute intervention. . Patient verbalized understanding and all questions were answered. . See orders for this visit as documented in the electronic medical record. . Patient received an After Visit Summary.  CMA or LPN served as scribe during this visit. History, Physical, and Plan performed by medical provider. The above documentation has been reviewed and is accurate and complete.  Jarold Motto, PA-C [place] [place]  [place] 03/27/2019  Follow-up: No follow-ups on file.

## 2019-03-28 ENCOUNTER — Other Ambulatory Visit: Payer: Self-pay | Admitting: Physician Assistant

## 2019-03-28 LAB — IRON,TIBC AND FERRITIN PANEL
%SAT: 25 % (calc) (ref 16–45)
Ferritin: 12 ng/mL — ABNORMAL LOW (ref 16–154)
Iron: 120 ug/dL (ref 40–190)
TIBC: 475 mcg/dL (calc) — ABNORMAL HIGH (ref 250–450)

## 2019-03-28 MED ORDER — "BD SYRINGE/NEEDLE 25G X 5/8"" 3 ML MISC": 1.0000 | 0 refills | Status: DC | PRN

## 2019-03-28 MED ORDER — CYANOCOBALAMIN 1000 MCG/ML IJ SOLN: INTRAMUSCULAR | 15 refills | Status: DC

## 2019-03-28 MED ORDER — VITAMIN D (ERGOCALCIFEROL) 1.25 MG (50000 UNIT) PO CAPS: 50000.0000 [IU] | ORAL_CAPSULE | ORAL | 0 refills | Status: DC

## 2019-05-08 ENCOUNTER — Telehealth: Payer: Self-pay

## 2019-05-08 NOTE — Telephone Encounter (Signed)
lvm regarding flu vaccine. 

## 2019-05-13 NOTE — Progress Notes (Signed)
I acted as a Neurosurgeon for [employer], PA-C Corky Mull, LPN  Subjective:    Aubrei Bouchie is a 24 y.o. female and is here for a comprehensive physical exam.  HPI  There are no preventive care reminders to display for this patient.  Acute Concerns: None  Chronic Issues: Vitamin B12 deficiency -- currently self-administering B12 injections -- currently on monthly dosages, felt like energy levels were improved with weekly dosages Vitamin D deficiency -- currently on MVI only, finished weekly vitamin D Depression/MTHFR gene mutation -- takes Deplin 15, and would like refill on this today; sees therapist regularly  Health Maintenance: Immunizations -- will check records for Tetanus Colonoscopy -- N/A Mammogram -- N/A PAP -- UTD, see GYN in April Bone Density -- N/A Diet -- vegetarian diet Caffeine intake -- none Sleep habits -- overall good Exercise -- none scheduled Current Weight -- Weight: 120 lb 8 oz (54.7 kg)  Weight History: Wt Readings from Last 10 Encounters:  05/14/19 120 lb 8 oz (54.7 kg)  03/27/19 118 lb 4 oz (53.6 kg)  07/12/18 115 lb (52.2 kg)  04/05/18 112 lb (50.8 kg)  01/08/18 120 lb 3.2 oz (54.5 kg)   Mood -- felt improved with weekly B12 Patient's last menstrual period was 05/10/2019. Period characteristics -- well managed with OCPs Birth control -- Junel Fe Would like STD testing today  Depression screen [facility] 2/9 05/14/2019  Decreased Interest 0  Down, Depressed, Hopeless 0  PHQ - 2 Score 0  Altered sleeping -  Tired, decreased energy -  Change in appetite -  Feeling bad or failure about yourself  -  Trouble concentrating -  Moving slowly or fidgety/restless -  Suicidal thoughts -  PHQ-9 Score -  Difficult doing work/chores -   Not UTD with dentist   Other providers/specialists: Patient Care Team: Jarold Motto, [place] as PCP - General (Physician Assistant)   PMHx, SurgHx, SocialHx, Medications, and Allergies were  reviewed in the Visit Navigator and updated as appropriate.   Past Medical History:  Diagnosis Date  . Asthma, childhood, exercise-induced   . Depression     History reviewed. No pertinent surgical history.   Family History  Problem Relation Age of Onset  . Hypothyroidism Mother   . Parkinson's disease Maternal Grandmother   . Lung cancer Maternal Grandmother   . Heart attack Maternal Grandfather   . Hyperlipidemia Maternal Grandfather   . Mental illness Maternal Grandfather   . Schizophrenia Maternal Grandfather   . Heart disease Maternal Grandfather   . Alcohol abuse Paternal Grandfather   . Mood Disorder Maternal Uncle   . Bipolar disorder Maternal Uncle   . Breast cancer Neg Hx   . Colon cancer Neg Hx     Social History   Tobacco Use  . Smoking status: Never Smoker  . Smokeless tobacco: Never Used  Substance Use Topics  . Alcohol use: Yes    Comment: OCC  . Drug use: Never    Review of Systems:   Review of Systems  Constitutional: Negative for chills, fever, malaise/fatigue and weight loss.  HENT: Negative for hearing loss, sinus pain and sore throat.   Eyes: Negative for blurred vision.  Respiratory: Negative for cough and shortness of breath.   Cardiovascular: Negative for chest pain, palpitations and leg swelling.  Gastrointestinal: Negative for abdominal pain, constipation, diarrhea, heartburn, nausea and vomiting.  Genitourinary: Negative for dysuria, frequency and urgency.  Musculoskeletal: Negative for back pain, myalgias and neck pain.  Skin:  Negative for itching and rash.  Neurological: Negative for dizziness, tingling, seizures, loss of consciousness and headaches.  Endo/Heme/Allergies: Negative for polydipsia.  Psychiatric/Behavioral: Negative for depression. The patient is not nervous/anxious.   All other systems reviewed and are negative.   Objective:   BP 118/70 (BP Location: Left Arm, Patient Position: Sitting, Cuff Size: Normal)   Pulse  84   Temp (!) 97.5 F (36.4 C) (Temporal)   Ht 5\' 5"  (1.651 m)   Wt 120 lb 8 oz (54.7 kg)   LMP 05/10/2019   SpO2 97%   BMI 20.05 kg/m   General Appearance:    Alert, cooperative, no distress, appears stated age  Head:    Normocephalic, without obvious abnormality, atraumatic  Eyes:    PERRL, conjunctiva/corneas clear, EOM's intact, fundi    benign, both eyes  Ears:    Normal TM's and external ear canals, both ears  Nose:   Nares normal, septum midline, mucosa normal, no drainage    or sinus tenderness  Throat:   Lips, mucosa, and tongue normal; teeth and gums normal  Neck:   Supple, symmetrical, trachea midline, no adenopathy;    thyroid:  no enlargement/tenderness/nodules; no carotid   bruit or JVD  Back:     Symmetric, no curvature, ROM normal, no CVA tenderness  Lungs:     Clear to auscultation bilaterally, respirations unlabored  Chest Wall:    No tenderness or deformity   Heart:    Regular rate and rhythm, S1 and S2 normal, no murmur, rub   or gallop  Breast Exam:    Deferred  Abdomen:     Soft, non-tender, bowel sounds active all four quadrants,    no masses, no organomegaly  Genitalia:    Deferred  Rectal:    Deferred  Extremities:   Extremities normal, atraumatic, no cyanosis or edema  Pulses:   2+ and symmetric all extremities  Skin:   Skin color, texture, turgor normal, no rashes or lesions  Lymph nodes:   Cervical, supraclavicular, and axillary nodes normal  Neurologic:   CNII-XII intact, normal strength, sensation and reflexes    throughout     Assessment/Plan:   Samyuktha was seen today for annual exam.  Diagnoses and all orders for this visit:  Routine physical examination Today patient counseled on age appropriate routine health concerns for screening and prevention, each reviewed and up to date or declined. Immunizations reviewed and up to date or declined. Labs ordered and reviewed. Risk factors for depression reviewed and negative. Hearing function and  visual acuity are intact. ADLs screened and addressed as needed. Functional ability and level of safety reviewed and appropriate. Education, counseling and referrals performed based on assessed risks today. Patient provided with a copy of personalized plan for preventive services.  Episodic mood disorder (HCC); Heterozygous for MTHFR gene mutation Well controlled per patient. Will refill Deplin 15. She has psychiatry on standby if she needs to resume. Continue talk therapy. I discussed with patient that if they develop any SI, to tell someone immediately and seek medical attention. -     L-Methylfolate-Algae (DEPLIN 15) 15-90.314 MG CAPS; Take 15 mg by mouth daily.  Screening examination for STD (sexually transmitted disease) Continue regular use of condoms. Update STD panel today. Will treat accordingly. -     Urine cytology ancillary only([place]) -     RPR -     HIV Antibody (routine testing w rflx)  Encounter for lipid screening for cardiovascular disease -  Lipid panel  B12 deficiency; Vitamin D deficiency Will update her labs in about 3 weeks and determine need for further supplementation.   Well Adult Exam: Labs ordered: Yes. Patient counseling was done. See below for items discussed. Discussed the patient's BMI. The BMI is in the acceptable range Follow up as needed for acute illness.  Patient Counseling:   [x]     Nutrition: Stressed importance of moderation in sodium/caffeine intake, saturated fat and cholesterol, caloric balance, sufficient intake of fresh fruits, vegetables, fiber, calcium, iron, and 1 mg of folate supplement per day (for females capable of pregnancy).   [x]      Stressed the importance of regular exercise.    [x]     Substance Abuse: Discussed cessation/primary prevention of tobacco, alcohol, or other drug use; driving or other dangerous activities under the influence; availability of treatment for abuse.    [x]      Injury prevention: Discussed  safety belts, safety helmets, smoke detector, smoking near bedding or upholstery.    [x]      Sexuality: Discussed sexually transmitted diseases, partner selection, use of condoms, avoidance of unintended pregnancy  and contraceptive alternatives.    [x]     Dental health: Discussed importance of regular tooth brushing, flossing, and dental visits.   [x]      Health maintenance and immunizations reviewed. Please refer to Health maintenance section.   CMA or LPN served as scribe during this visit. History, Physical, and Plan performed by medical provider. The above documentation has been reviewed and is accurate and complete.   Inda Coke, PA-C [place]

## 2019-05-14 ENCOUNTER — Other Ambulatory Visit (HOSPITAL_COMMUNITY)
Admission: RE | Admit: 2019-05-14 | Discharge: 2019-05-14 | Disposition: A | Discharge status: Home / Self Care | Payer: 59 | Source: Ambulatory Visit | Attending: Physician Assistant | Admitting: Physician Assistant

## 2019-05-14 ENCOUNTER — Encounter: Payer: Self-pay | Admitting: Physician Assistant

## 2019-05-14 ENCOUNTER — Other Ambulatory Visit: Payer: Self-pay

## 2019-05-14 ENCOUNTER — Ambulatory Visit (INDEPENDENT_AMBULATORY_CARE_PROVIDER_SITE_OTHER): Payer: No Typology Code available for payment source | Admitting: Physician Assistant

## 2019-05-14 VITALS — BP 118/70 | HR 84 | Temp 97.5°F | Ht 65.0 in | Wt 120.5 lb

## 2019-05-14 DIAGNOSIS — Z0000 Encounter for general adult medical examination without abnormal findings: Secondary | ICD-10-CM | POA: Diagnosis not present

## 2019-05-14 DIAGNOSIS — Z113 Encounter for screening for infections with a predominantly sexual mode of transmission: Secondary | ICD-10-CM | POA: Insufficient documentation

## 2019-05-14 DIAGNOSIS — Z1589 Genetic susceptibility to other disease: Secondary | ICD-10-CM | POA: Diagnosis not present

## 2019-05-14 DIAGNOSIS — Z13220 Encounter for screening for lipoid disorders: Secondary | ICD-10-CM

## 2019-05-14 DIAGNOSIS — E538 Deficiency of other specified B group vitamins: Secondary | ICD-10-CM

## 2019-05-14 DIAGNOSIS — F39 Unspecified mood [affective] disorder: Secondary | ICD-10-CM | POA: Diagnosis not present

## 2019-05-14 DIAGNOSIS — Z136 Encounter for screening for cardiovascular disorders: Secondary | ICD-10-CM | POA: Diagnosis not present

## 2019-05-14 DIAGNOSIS — E559 Vitamin D deficiency, unspecified: Secondary | ICD-10-CM

## 2019-05-14 LAB — LIPID PANEL
Cholesterol: 188 mg/dL (ref 0–200)
HDL: 50.3 mg/dL (ref >39.00)
LDL Cholesterol: 121 mg/dL — ABNORMAL HIGH (ref 0–99)
NonHDL: 138.04
Total CHOL/HDL Ratio: 4
Triglycerides: 87 mg/dL (ref 0.0–149.0)
VLDL: 17.4 mg/dL (ref 0.0–40.0)

## 2019-05-14 MED ORDER — DEPLIN 15 15-90.314 MG PO CAPS: 15.0000 mg | ORAL_CAPSULE | Freq: Every day | ORAL | 1 refills | Status: DC

## 2019-05-14 NOTE — Patient Instructions (Signed)
It was great to see you!  Please go to the lab for blood work.   **Make an appointment for B12 and vitamin D lab update in about 3 weeks  Our office will call you with your results unless you have chosen to receive results via MyChart.  If your blood work is normal we will follow-up each year for physicals and as scheduled for chronic medical problems.  If anything is abnormal we will treat accordingly and get you in for a follow-up.  Take care,  [facility] Maintenance, Female Adopting a healthy lifestyle and getting preventive care are important in promoting health and wellness. Ask your health care provider about:  The right schedule for you to have regular tests and exams.  Things you can do on your own to prevent diseases and keep yourself healthy. What should I know about diet, weight, and exercise? Eat a healthy diet   Eat a diet that includes plenty of vegetables, fruits, low-fat dairy products, and lean protein.  Do not eat a lot of foods that are high in solid fats, added sugars, or sodium. Maintain a healthy weight Body mass index (BMI) is used to identify weight problems. It estimates body fat based on height and weight. Your health care provider can help determine your BMI and help you achieve or maintain a healthy weight. Get regular exercise Get regular exercise. This is one of the most important things you can do for your health. Most adults should:  Exercise for at least 150 minutes each week. The exercise should increase your heart rate and make you sweat (moderate-intensity exercise).  Do strengthening exercises at least twice a week. This is in addition to the moderate-intensity exercise.  Spend less time sitting. Even light physical activity can be beneficial. Watch cholesterol and blood lipids Have your blood tested for lipids and cholesterol at 25 years of age, then have this test every 5 years. Have your cholesterol levels checked more often  if:  Your lipid or cholesterol levels are high.  You are older than 25 years of age.  You are at high risk for heart disease. What should I know about cancer screening? Depending on your health history and family history, you may need to have cancer screening at various ages. This may include screening for:  Breast cancer.  Cervical cancer.  Colorectal cancer.  Skin cancer.  Lung cancer. What should I know about heart disease, diabetes, and high blood pressure? Blood pressure and heart disease  High blood pressure causes heart disease and increases the risk of stroke. This is more likely to develop in people who have high blood pressure readings, are of African descent, or are overweight.  Have your blood pressure checked: ? Every 3-5 years if you are 55-35 years of age. ? Every year if you are 27 years old or older. Diabetes Have regular diabetes screenings. This checks your fasting blood sugar level. Have the screening done:  Once every three years after age 27 if you are at a normal weight and have a low risk for diabetes.  More often and at a younger age if you are overweight or have a high risk for diabetes. What should I know about preventing infection? Hepatitis B If you have a higher risk for hepatitis B, you should be screened for this virus. Talk with your health care provider to find out if you are at risk for hepatitis B infection. Hepatitis C Testing is recommended for:  Everyone  born from [birth year] through 1966.  Anyone with known risk factors for hepatitis C. Sexually transmitted infections (STIs)  Get screened for STIs, including gonorrhea and chlamydia, if: ? You are sexually active and are younger than 25 years of age. ? You are older than 25 years of age and your health care provider tells you that you are at risk for this type of infection. ? Your sexual activity has changed since you were last screened, and you are at increased risk for chlamydia or  gonorrhea. Ask your health care provider if you are at risk.  Ask your health care provider about whether you are at high risk for HIV. Your health care provider may recommend a prescription medicine to help prevent HIV infection. If you choose to take medicine to prevent HIV, you should first get tested for HIV. You should then be tested every 3 months for as long as you are taking the medicine. Pregnancy  If you are about to stop having your period (premenopausal) and you may become pregnant, seek counseling before you get pregnant.  Take 400 to 800 micrograms (mcg) of folic acid every day if you become pregnant.  Ask for birth control (contraception) if you want to prevent pregnancy. Osteoporosis and menopause Osteoporosis is a disease in which the bones lose minerals and strength with aging. This can result in bone fractures. If you are 38 years old or older, or if you are at risk for osteoporosis and fractures, ask your health care provider if you should:  Be screened for bone loss.  Take a calcium or vitamin D supplement to lower your risk of fractures.  Be given hormone replacement therapy (HRT) to treat symptoms of menopause. Follow these instructions at home: Lifestyle  Do not use any products that contain nicotine or tobacco, such as cigarettes, e-cigarettes, and chewing tobacco. If you need help quitting, ask your health care provider.  Do not use street drugs.  Do not share needles.  Ask your health care provider for help if you need support or information about quitting drugs. Alcohol use  Do not drink alcohol if: ? Your health care provider tells you not to drink. ? You are pregnant, may be pregnant, or are planning to become pregnant.  If you drink alcohol: ? Limit how much you use to 0-1 drink a day. ? Limit intake if you are breastfeeding.  Be aware of how much alcohol is in your drink. In the [place] one drink equals one 12 oz bottle of beer (355 mL), one 5 oz  glass of wine (148 mL), or one 1 oz glass of hard liquor (44 mL). General instructions  Schedule regular health, dental, and eye exams.  Stay current with your vaccines.  Tell your health care provider if: ? You often feel depressed. ? You have ever been abused or do not feel safe at home. Summary  Adopting a healthy lifestyle and getting preventive care are important in promoting health and wellness.  Follow your health care provider's instructions about healthy diet, exercising, and getting tested or screened for diseases.  Follow your health care provider's instructions on monitoring your cholesterol and blood pressure. This information is not intended to replace advice given to you by your health care provider. Make sure you discuss any questions you have with your health care provider. Document Revised: 01/30/2018 Document Reviewed: 01/30/2018 Elsevier Patient Education  2020 [employer].

## 2019-05-15 LAB — RPR: RPR Ser Ql: NONREACTIVE

## 2019-05-15 LAB — URINE CYTOLOGY ANCILLARY ONLY
Chlamydia: NEGATIVE
Comment: NEGATIVE
Comment: NEGATIVE
Comment: NORMAL
Neisseria Gonorrhea: NEGATIVE
Trichomonas: NEGATIVE

## 2019-05-15 LAB — HIV ANTIBODY (ROUTINE TESTING W REFLEX): HIV 1&2 Ab, 4th Generation: NONREACTIVE

## 2019-06-04 ENCOUNTER — Other Ambulatory Visit: Payer: Self-pay

## 2019-06-04 ENCOUNTER — Other Ambulatory Visit: Payer: Self-pay | Admitting: Physician Assistant

## 2019-06-04 ENCOUNTER — Ambulatory Visit: Payer: No Typology Code available for payment source | Admitting: Physician Assistant

## 2019-06-04 ENCOUNTER — Other Ambulatory Visit (INDEPENDENT_AMBULATORY_CARE_PROVIDER_SITE_OTHER): Payer: 59

## 2019-06-04 DIAGNOSIS — E559 Vitamin D deficiency, unspecified: Secondary | ICD-10-CM | POA: Diagnosis not present

## 2019-06-04 DIAGNOSIS — E538 Deficiency of other specified B group vitamins: Secondary | ICD-10-CM | POA: Diagnosis not present

## 2019-06-04 LAB — VITAMIN B12: Vitamin B-12: 183 pg/mL — ABNORMAL LOW (ref 211–911)

## 2019-06-04 LAB — VITAMIN D 25 HYDROXY (VIT D DEFICIENCY, FRACTURES): VITD: 34.81 ng/mL (ref 30.00–100.00)

## 2019-06-04 MED ORDER — "BD SYRINGE/NEEDLE 25G X 5/8"" 3 ML MISC": 1.0000 | 0 refills | Status: AC | PRN

## 2019-06-04 MED ORDER — CYANOCOBALAMIN 1000 MCG/ML IJ SOLN: INTRAMUSCULAR | 15 refills | Status: DC

## 2019-06-04 NOTE — Progress Notes (Deleted)
Sylvia Mathews is a 24 y.o. female is here to discuss:  SCRIBE STATEMENT  History of Present Illness:   No chief complaint on file.   HPI  There are no preventive care reminders to display for this patient.  Past Medical History:  Diagnosis Date  . Asthma, childhood, exercise-induced   . Depression      Social History   Socioeconomic History  . Marital status: Single    Spouse name: Not on file  . Number of children: Not on file  . Years of education: Not on file  . Highest education level: Bachelor's degree (e.g., BA, AB, BS)  Occupational History  . 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 Smoking status: Never Smoker  . Smokeless tobacco: Never Used  Substance and Sexual Activity  . Alcohol use: Yes    Comment: OCC  . Drug use: Never  . Sexual activity: Yes    Partners: Female, Female    Birth control/protection: I.U.D.  Other Topics Concern  . Not on file  Social History Narrative   RN at [employer], works with new moms   Social Determinants of Health   Financial Resource Strain:   . Difficulty of Paying Living Expenses:   Food Insecurity:   . Worried About Programme researcher, broadcasting/film/video in the Last Year:   . Barista in the Last Year:   Transportation Needs:   . Freight forwarder (Medical):   [name] Lack of Transportation (Non-Medical):   Physical Activity:   . Days of Exercise per Week:   . Minutes of Exercise per Session:   Stress:   . Feeling of Stress :   Social Connections:   . Frequency of Communication with Friends and Family:   . Frequency of Social Gatherings with Friends and Family:   . Attends Religious Services:   . Active Member of Clubs or Organizations:   . Attends Banker Meetings:   [name] Marital Status:   Intimate Partner Violence:   . Fear of Current or Ex-Partner:   . Emotionally Abused:   [name] Physically Abused:   . Sexually Abused:     No past  surgical history on file.  Family History  Problem Relation Age of Onset  . Hypothyroidism Mother   . Parkinson's disease Maternal Grandmother   . Lung cancer Maternal Grandmother   . Heart attack Maternal Grandfather   . Hyperlipidemia Maternal Grandfather   . Mental illness Maternal Grandfather   . Schizophrenia Maternal Grandfather   . Heart disease Maternal Grandfather   . Alcohol abuse Paternal Grandfather   . Mood Disorder Maternal Uncle   . Bipolar disorder Maternal Uncle   . Breast cancer Neg Hx   . Colon cancer Neg Hx     PMHx, SurgHx, SocialHx, FamHx, Medications, and Allergies were reviewed in the Visit Navigator and updated as appropriate.   Patient Active Problem List   Diagnosis Date Noted  . Heterozygous for MTHFR gene mutation 05/14/2019  . Vegetarian 01/09/2018  . Vitamin D deficiency 01/09/2018  . B12 deficiency 01/09/2018  . Menorrhagia 11/28/2012    Social History   Tobacco Use  . Smoking status: Never Smoker  . Smokeless tobacco: Never Used  Substance Use Topics  . Alcohol use: Yes    Comment: OCC  . Drug use: Never    Current Medications and Allergies:    Current Outpatient Medications:  .  cyanocobalamin (,VITAMIN B-12,) 1000 MCG/ML injection, 1000 mcg (1 mg) injection once per week for four weeks, followed by 1000 mcg injection once per month., Disp: 1 mL, Rfl: 15 .  JUNEL FE 1/20 1-20 MG-MCG tablet, Take 1 tablet by mouth daily., Disp: , Rfl:  .  L-Methylfolate-Algae (DEPLIN 15) 15-90.314 MG CAPS, Take 15 mg by mouth daily., Disp: 90 capsule, Rfl: 1 .  SYRINGE-NEEDLE, DISP, 3 ML (B-D SYRINGE/NEEDLE 3CC/25GX5/8) 25G X 5/8" 3 ML MISC, 1 each by Does not apply route as needed., Disp: 12 each, Rfl: 0  Allergies  Allergen Reactions  . Lamotrigine   . Amoxicillin Rash    Review of Systems   ROS  Vitals:  There were no vitals filed for this visit.   There is no height or weight on file to calculate BMI.   Physical Exam:    Physical  Exam   Assessment and Plan:    There are no diagnoses linked to this encounter.  . Reviewed expectations re: course of current medical issues. . Discussed self-management of symptoms. . Outlined signs and symptoms indicating need for more acute intervention. . Patient verbalized understanding and all questions were answered. . See orders for this visit as documented in the electronic medical record. . Patient received an After Visit Summary.  ***  Inda Coke, PA-C [place], [place] 06/04/2019  Follow-up: No follow-ups on file.

## 2019-06-06 ENCOUNTER — Other Ambulatory Visit: Payer: Self-pay

## 2019-06-06 ENCOUNTER — Ambulatory Visit (INDEPENDENT_AMBULATORY_CARE_PROVIDER_SITE_OTHER): Payer: 59 | Admitting: Physician Assistant

## 2019-06-06 ENCOUNTER — Encounter: Payer: Self-pay | Admitting: Physician Assistant

## 2019-06-06 VITALS — BP 110/74 | HR 71 | Temp 98.1°F | Ht 65.0 in | Wt 122.0 lb

## 2019-06-06 DIAGNOSIS — E559 Vitamin D deficiency, unspecified: Secondary | ICD-10-CM

## 2019-06-06 DIAGNOSIS — E538 Deficiency of other specified B group vitamins: Secondary | ICD-10-CM | POA: Diagnosis not present

## 2019-06-06 MED ORDER — VITAMIN D (ERGOCALCIFEROL) 1.25 MG (50000 UNIT) PO CAPS: 50000.0000 [IU] | ORAL_CAPSULE | ORAL | 0 refills | Status: DC

## 2019-06-06 MED ORDER — CYANOCOBALAMIN 1000 MCG/ML IJ SOLN: INTRAMUSCULAR | 15 refills | Status: DC

## 2019-06-06 NOTE — Progress Notes (Signed)
Sylvia Mathews is a 24 y.o. female is here to discuss: Lab results  I acted as a Neurosurgeon for [employer], PA-C Corky Mull, LPN  History of Present Illness:   Chief Complaint  Patient presents with  . Discuss lab results    HPI   Pt is here to discuss lab results and plan of care.  Vitamin B12 deficiency Most recent B12 2d ago was 183. This is an increase from 112. She feels great on the B12 injections and feels like it has helped her mood and energy.  Vitamin D deficiency Most recent vit D was 34. This is an increase from 32. She felt improved on weekly 5000 IU vit D daily.   There are no preventive care reminders to display for this patient.  Past Medical History:  Diagnosis Date  . Asthma, childhood, exercise-induced   . Depression      Social History   Socioeconomic History  . Marital status: Single    Spouse name: Not on file  . Number of children: Not on file  . Years of education: Not on file  . Highest education level: Bachelor's degree (e.g., BA, AB, BS)  Occupational History  . 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 Smoking status: Never Smoker  . Smokeless tobacco: Never Used  Substance and Sexual Activity  . Alcohol use: Yes    Comment: OCC  . Drug use: Never  . Sexual activity: Yes    Partners: Female, Female    Birth control/protection: I.U.D.  Other Topics Concern  . Not on file  Social History Narrative   RN at [employer], works with new moms   Social Determinants of Health   Financial Resource Strain:   . Difficulty of Paying Living Expenses:   Food Insecurity:   . Worried About Programme researcher, broadcasting/film/video in the Last Year:   . Barista in the Last Year:   Transportation Needs:   . Freight forwarder (Medical):   [name] Lack of Transportation (Non-Medical):   Physical Activity:   . Days of Exercise per Week:   . Minutes of Exercise per Session:   Stress:   .  Feeling of Stress :   Social Connections:   . Frequency of Communication with Friends and Family:   . Frequency of Social Gatherings with Friends and Family:   . Attends Religious Services:   . Active Member of Clubs or Organizations:   . Attends Banker Meetings:   [name] Marital Status:   Intimate Partner Violence:   . Fear of Current or Ex-Partner:   . Emotionally Abused:   [name] Physically Abused:   . Sexually Abused:     History reviewed. No pertinent surgical history.  Family History  Problem Relation Age of Onset  . Hypothyroidism Mother   . Parkinson's disease Maternal Grandmother   . Lung cancer Maternal Grandmother   . Heart attack Maternal Grandfather   . Hyperlipidemia Maternal Grandfather   . Mental illness Maternal Grandfather   . Schizophrenia Maternal Grandfather   . Heart disease Maternal Grandfather   . Alcohol abuse Paternal Grandfather   . Mood Disorder Maternal Uncle   . Bipolar disorder Maternal Uncle   . Breast cancer Neg Hx   . Colon cancer Neg Hx     PMHx, SurgHx, SocialHx, FamHx, Medications, and Allergies were reviewed in the Visit Navigator and updated as  appropriate.   Patient Active Problem List   Diagnosis Date Noted  . Heterozygous for MTHFR gene mutation 05/14/2019  . Vegetarian 01/09/2018  . Vitamin D deficiency 01/09/2018  . B12 deficiency 01/09/2018  . Menorrhagia 11/28/2012    Social History   Tobacco Use  . Smoking status: Never Smoker  . Smokeless tobacco: Never Used  Substance Use Topics  . Alcohol use: Yes    Comment: OCC  . Drug use: Never    Current Medications and Allergies:    Current Outpatient Medications:  .  cyanocobalamin (,VITAMIN B-12,) 1000 MCG/ML injection, 1000 mcg (1 mg) injection once per week for four weeks, followed by 1000 mcg injection twice per month., Disp: 1 mL, Rfl: 15 .  JUNEL FE 1/20 1-20 MG-MCG tablet, Take 1 tablet by mouth daily., Disp: , Rfl:  .  L-Methylfolate-Algae (DEPLIN 15)  15-90.314 MG CAPS, Take 15 mg by mouth daily., Disp: 90 capsule, Rfl: 1 .  SYRINGE-NEEDLE, DISP, 3 ML (B-D SYRINGE/NEEDLE 3CC/25GX5/8) 25G X 5/8" 3 ML MISC, 1 each by Does not apply route as needed., Disp: 12 each, Rfl: 0 .  Vitamin D, Ergocalciferol, (DRISDOL) 1.25 MG (50000 UNIT) CAPS capsule, Take 1 capsule (50,000 Units total) by mouth every 7 (seven) days., Disp: 8 capsule, Rfl: 0   Allergies  Allergen Reactions  . Lamotrigine   . Amoxicillin Rash    Review of Systems   ROS  Negative unless otherwise specified per HPI.  Vitals:   Vitals:   06/06/19 0826  BP: 110/74  Pulse: 71  Temp: 98.1 F (36.7 C)  TempSrc: Temporal  SpO2: 99%  Weight: 122 lb (55.3 kg)  Height: 5\' 5"  (1.651 m)     Body mass index is 20.3 kg/m.   Physical Exam:    Physical Exam Vitals and nursing note reviewed.  Constitutional:      General: She is not in acute distress.    Appearance: She is well-developed. She is not ill-appearing or toxic-appearing.  Cardiovascular:     Rate and Rhythm: Normal rate and regular rhythm.     Pulses: Normal pulses.     Heart sounds: Normal heart sounds, S1 normal and S2 normal.     Comments: No LE edema Pulmonary:     Effort: Pulmonary effort is normal.     Breath sounds: Normal breath sounds.  Skin:    General: Skin is warm and dry.  Neurological:     Mental Status: She is alert.     GCS: GCS eye subscore is 4. GCS verbal subscore is 5. GCS motor subscore is 6.  Psychiatric:        Speech: Speech normal.        Behavior: Behavior normal. Behavior is cooperative.      Assessment and Plan:    Sylvia Mathews was seen today for discuss lab results.  Diagnoses and all orders for this visit:  B12 deficiency Resume B12 injections weekly x 1 month and then every other week. Follow-up in 3 months.  Vitamin D deficiency Resume 50k IU weekly with goal of Vit level around 50. Follow-up in 3 months.  Other orders -     Vitamin D, Ergocalciferol, (DRISDOL)  1.25 MG (50000 UNIT) CAPS capsule; Take 1 capsule (50,000 Units total) by mouth every 7 (seven) days. -     cyanocobalamin (,VITAMIN B-12,) 1000 MCG/ML injection; 1000 mcg (1 mg) injection once per week for four weeks, followed by 1000 mcg injection twice per month.   . Reviewed  expectations re: course of current medical issues. . Discussed self-management of symptoms. . Outlined signs and symptoms indicating need for more acute intervention. . Patient verbalized understanding and all questions were answered. . See orders for this visit as documented in the electronic medical record. . Patient received an After Visit Summary.  CMA or LPN served as scribe during this visit. History, Physical, and Plan performed by medical provider. The above documentation has been reviewed and is accurate and complete.   Jarold Motto, PA-C [place] [place] 06/06/2019  Follow-up: No follow-ups on file.

## 2019-06-11 ENCOUNTER — Encounter: Payer: Self-pay | Admitting: Physician Assistant

## 2019-06-19 ENCOUNTER — Ambulatory Visit (INDEPENDENT_AMBULATORY_CARE_PROVIDER_SITE_OTHER): Payer: 59 | Admitting: Psychiatry

## 2019-06-19 ENCOUNTER — Other Ambulatory Visit: Payer: Self-pay

## 2019-06-19 ENCOUNTER — Encounter: Payer: Self-pay | Admitting: Psychiatry

## 2019-06-19 DIAGNOSIS — Z1589 Genetic susceptibility to other disease: Secondary | ICD-10-CM

## 2019-06-19 DIAGNOSIS — F32A Depression, unspecified: Secondary | ICD-10-CM

## 2019-06-19 DIAGNOSIS — F329 Major depressive disorder, single episode, unspecified: Secondary | ICD-10-CM

## 2019-06-19 DIAGNOSIS — F419 Anxiety disorder, unspecified: Secondary | ICD-10-CM | POA: Diagnosis not present

## 2019-06-19 MED ORDER — L-METHYLFOLATE 15 MG PO TABS: 15.0000 mg | ORAL_TABLET | Freq: Every day | ORAL | 5 refills | Status: AC

## 2019-06-19 MED ORDER — VIIBRYD STARTER PACK 10 & 20 MG PO KIT: PACK | ORAL | 0 refills | Status: DC

## 2019-06-19 NOTE — Progress Notes (Signed)
Sylvia Mathews [id number] 1995/08/20 24 y.o.  Subjective:   Patient ID:  Sylvia Mathews is a 24 y.o. (DOB [birth year]) female.  Chief Complaint:  Chief Complaint  Patient presents with  . Anxiety  . Depression    HPI Sylvia Mathews presents to the office today for follow-up of anxiety and depression. She has been dx'd with Vit B12 deficiency and started Vitamin B12 injections and this has helped with energy and depression. She reports that she has been experiencing increased sensory issues. She reports that a minor irritation with her clothing can turn into a more significant reaction and feels like she has to change her clothing immediately. She reports that she is irritated by multiple auditory stimuli. She reports that she has always preferred the quiet. Has always had issues with food textures. Has always been bothered by light coming through the trees while driving and creating a strobe-like effect. She reports that at times she is seeking sensory input.   She has noticed some increased anxiety. Reports that at times the hyper-sensitivity to sensory input is triggering anxiety. Has had less worry about deadlines. Has had some anxiety at work with trying to balance several different responsibilities. Has occ chest tightness. Has felt as if she is going to have a panic attack but has not had any recent full blown panic attacks. She reports that she has difficulty focusing on things visually at times and may occur more when she is feeling overwhelmed. Has noticed some increased dissociation.   "Overall my mood has been better since starting Vitamin B12." She continues to notice some depression- "but it's a lot more mild." Has some irritability. Sleep has been slightly diminished with work and school schedule. Averaging 5-6 hours a night. Appetite has fluctuated some. Energy has improved with Vitamin B12. Motivation has improved- "still not great." Has been having  difficulty sustaining focus and will get up after 10 minutes of studying. Has had difficulty with memory. Denies anhedonia. Has been enjoying being around people more. Denies SI. Has some intrusive thoughts without intent.  Now working during the days and is enjoying this. Working 45-50 hours a week and going to graduate school full-time.   Has been seeing Veronda Prude, LCSW for therapy.   Past Psychiatric Medication Trials: Wellbutrin- Initially effective and then no longer as effective. May be causing some nausea.  Buspar- Minimally effective.  Zoloft- Nausea.  Citalopram- Intense tachycardia and increased heart rate. Prozac- Lost 20 lbs in a short period of time due to nausea and loss of appetite. Went a week without sleeping, impulsive purchases and piercing's. Cymbalta- Ineffective. Felt "numb." Pristiq- did not like. Lamictal- rash Latuda- Severe somnolence.   PHQ2-9     Office Visit from 05/14/2019 in [facility] Visit from 01/08/2018 in [facility]  PHQ-2 Total Score  0  0  PHQ-9 Total Score  --  4       Review of Systems:  Review of Systems  Gastrointestinal: Negative.   Musculoskeletal: Negative for gait problem.  Neurological: Negative for tremors.       Reports occ fine motor difficulties. Having some RLS  Psychiatric/Behavioral:       Please refer to HPI    Medications: I have reviewed the patient's current medications.  Current Outpatient Medications  Medication Sig Dispense Refill  . cyanocobalamin (,VITAMIN B-12,) 1000 MCG/ML injection 1000 mcg (1 mg) injection once per week for four weeks, followed by 1000 mcg injection twice  per month. 1 mL 15  . L-Methylfolate-Algae (DEPLIN 15) 15-90.314 MG CAPS Take 15 mg by mouth daily. 90 capsule 1  . Levonorgestrel (SKYLA) 13.5 MG IUD by Intrauterine route.    . Vitamin D, Ergocalciferol, (DRISDOL) 1.25 MG (50000 UNIT) CAPS capsule Take 1 capsule (50,000 Units  total) by mouth every 7 (seven) days. 8 capsule 0  . L-Methylfolate 15 MG TABS Take 1 tablet (15 mg total) by mouth daily. 30 tablet 5  . SYRINGE-NEEDLE, DISP, 3 ML (B-D SYRINGE/NEEDLE 3CC/25GX5/8) 25G X 5/8" 3 ML MISC 1 each by Does not apply route as needed. 12 each 0  . Vilazodone HCl (VIIBRYD STARTER PACK) 10 & 20 MG KIT Take 5 mg po qd for 7-10 days, then increase to 10 mg po qd 2 kit 0   No current facility-administered medications for this visit.    Medication Side Effects: None  Allergies:  Allergies  Allergen Reactions  . Lamotrigine   . Amoxicillin Rash    Past Medical History:  Diagnosis Date  . Asthma, childhood, exercise-induced   . Depression     Family History  Problem Relation Age of Onset  . Hypothyroidism Mother   . Parkinson's disease Maternal Grandmother   . Lung cancer Maternal Grandmother   . Heart attack Maternal Grandfather   . Hyperlipidemia Maternal Grandfather   . Mental illness Maternal Grandfather   . Schizophrenia Maternal Grandfather   . Heart disease Maternal Grandfather   . Alcohol abuse Paternal Grandfather   . Mood Disorder Maternal Uncle   . Bipolar disorder Maternal Uncle   . Breast cancer Neg Hx   . Colon cancer Neg Hx     Social History   Socioeconomic History  . Marital status: Single    Spouse name: Not on file  . Number of children: Not on file  . Years of education: Not on file  . Highest education level: Bachelor's degree (e.g., BA, AB, BS)  Occupational History  . 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 Smoking status: Never Smoker  . Smokeless tobacco: Never Used  Substance and Sexual Activity  . Alcohol use: Yes    Comment: OCC  . Drug use: Never  . Sexual activity: Yes    Partners: Female, Female    Birth control/protection: I.U.D.  Other Topics Concern  . Not on file  Social History Narrative   RN at [employer], works with new moms   Social  Determinants of Health   Financial Resource Strain:   . Difficulty of Paying Living Expenses:   Food Insecurity:   . Worried About Charity fundraiser in the Last Year:   . Arboriculturist in the Last Year:   Transportation Needs:   . Film/video editor (Medical):   [name] Lack of Transportation (Non-Medical):   Physical Activity:   . Days of Exercise per Week:   . Minutes of Exercise per Session:   Stress:   . Feeling of Stress :   Social Connections:   . Frequency of Communication with Friends and Family:   . Frequency of Social Gatherings with Friends and Family:   . Attends Religious Services:   . Active Member of Clubs or Organizations:   . Attends Archivist Meetings:   [name] Marital Status:   Intimate Partner Violence:   . Fear of Current or Ex-Partner:   . Emotionally Abused:   [name] Physically  Abused:   . Sexually Abused:     Past Medical History, Surgical history, Social history, and Family history were reviewed and updated as appropriate.   Please see review of systems for further details on the patient's review from today.   Objective:   Physical Exam:  There were no vitals taken for this visit.  Physical Exam Constitutional:      General: She is not in acute distress. Musculoskeletal:        General: No deformity.  Neurological:     Mental Status: She is alert and oriented to person, place, and time.     Coordination: Coordination normal.  Psychiatric:        Attention and Perception: Attention and perception normal. She does not perceive auditory or visual hallucinations.        Mood and Affect: Mood is anxious. Affect is not labile, blunt, angry or inappropriate.        Speech: Speech normal.        Behavior: Behavior normal.        Thought Content: Thought content normal. Thought content is not paranoid or delusional. Thought content does not include homicidal or suicidal ideation. Thought content does not include homicidal or suicidal plan.         Cognition and Memory: Cognition and memory normal.        Judgment: Judgment normal.     Comments: Insight intact Mood presents as less depressed     Lab Review:     Component Value Date/Time   NA 136 03/27/2019 0850   K 4.4 03/27/2019 0850   CL 106 03/27/2019 0850   CO2 23 03/27/2019 0850   GLUCOSE 88 03/27/2019 0850   BUN 9 03/27/2019 0850   CREATININE 0.69 03/27/2019 0850   CALCIUM 9.3 03/27/2019 0850   PROT 6.9 03/27/2019 0850   ALBUMIN 4.1 03/27/2019 0850   AST 14 03/27/2019 0850   ALT 11 03/27/2019 0850   ALKPHOS 70 03/27/2019 0850   BILITOT 0.4 03/27/2019 0850       Component Value Date/Time   WBC 4.2 03/27/2019 0850   RBC 4.38 03/27/2019 0850   HGB 12.7 03/27/2019 0850   HCT 38.5 03/27/2019 0850   PLT 225.0 03/27/2019 0850   MCV 88.0 03/27/2019 0850   MCHC 32.9 03/27/2019 0850   RDW 12.9 03/27/2019 0850   LYMPHSABS 2.2 03/27/2019 0850   MONOABS 0.3 03/27/2019 0850   EOSABS 0.1 03/27/2019 0850   BASOSABS 0.1 03/27/2019 0850    No results found for: POCLITH, LITHIUM   No results found for: PHENYTOIN, PHENOBARB, VALPROATE, CBMZ   .res Assessment: Plan:   Reviewed results of pharmacogenetic testing and discussed tx options with pt to include potential benefits, risks, and side effects of Viibryd. Pt agrees to trial of Viibryd. Counseled to take Viibryd with food to minimize risk of GI side effects. Will start Viibryd 5 mg po qd for 7-10 days, then increase to 10 mg po qd for anxiety and depression.  Discussed that L-methyl-folate is available in generic form. Will send script to pharmacy.  Pt to f/u in 4 weeks or sooner if clinically indicated.  Recommend continuing psychotherapy. Patient advised to contact office with any questions, adverse effects, or acute worsening in signs and symptoms.  Sylvia Mathews was seen today for anxiety and depression.  Diagnoses and all orders for this visit:  Anxiety disorder, unspecified type -     Vilazodone HCl (VIIBRYD  STARTER PACK) 10 & 20 MG KIT; Take  5 mg po qd for 7-10 days, then increase to 10 mg po qd  Heterozygous for MTHFR gene mutation -     L-Methylfolate 15 MG TABS; Take 1 tablet (15 mg total) by mouth daily.  Depression, unspecified depression type     Please see After Visit Summary for patient specific instructions.  Future Appointments  Date Time Provider [place]  09/02/2019  8:15 AM [facility]    No orders of the defined types were placed in this encounter.   -------------------------------

## 2019-06-26 ENCOUNTER — Other Ambulatory Visit: Payer: Self-pay | Admitting: Physician Assistant

## 2019-07-09 ENCOUNTER — Other Ambulatory Visit: Payer: Self-pay | Admitting: Obstetrics and Gynecology

## 2019-07-09 DIAGNOSIS — Z30431 Encounter for routine checking of intrauterine contraceptive device: Secondary | ICD-10-CM

## 2019-07-16 ENCOUNTER — Encounter: Payer: Self-pay | Admitting: Physician Assistant

## 2019-07-16 ENCOUNTER — Ambulatory Visit
Admission: RE | Admit: 2019-07-16 | Discharge: 2019-07-16 | Disposition: A | Discharge status: Home / Self Care | Payer: 59 | Source: Ambulatory Visit | Attending: Obstetrics and Gynecology | Admitting: Obstetrics and Gynecology

## 2019-07-16 DIAGNOSIS — Z30431 Encounter for routine checking of intrauterine contraceptive device: Secondary | ICD-10-CM

## 2019-07-23 ENCOUNTER — Telehealth: Payer: Self-pay | Admitting: Physician Assistant

## 2019-07-23 NOTE — Telephone Encounter (Signed)
Patient is calling in stating she had some issues so she went to see her GYN doctor to which they ran some tests and so patient was referred to follow up with [facility] and to make sure that she is aware that they sent notes over with everything that is going on.

## 2019-07-24 NOTE — Telephone Encounter (Signed)
Received office notes and tests results from GYN. Pt scheduled for 07/30/2019.

## 2019-07-30 ENCOUNTER — Encounter: Payer: Self-pay | Admitting: Physician Assistant

## 2019-07-30 ENCOUNTER — Other Ambulatory Visit: Payer: Self-pay

## 2019-07-30 ENCOUNTER — Ambulatory Visit: Payer: 59 | Admitting: Physician Assistant

## 2019-07-30 VITALS — BP 118/70 | HR 89 | Temp 98.3°F | Resp 18 | Ht 65.0 in | Wt 126.8 lb

## 2019-07-30 DIAGNOSIS — R102 Pelvic and perineal pain: Secondary | ICD-10-CM

## 2019-07-30 DIAGNOSIS — B001 Herpesviral vesicular dermatitis: Secondary | ICD-10-CM

## 2019-07-30 MED ORDER — VALACYCLOVIR HCL 1 G PO TABS: ORAL_TABLET | ORAL | 1 refills | Status: DC

## 2019-07-30 NOTE — Patient Instructions (Signed)
It was great to see you!  If symptoms persist after pelvic floor PT, come back to see me.  Valtrex sent.  Take care,  Jarold Motto PA-C

## 2019-07-30 NOTE — Progress Notes (Signed)
Sylvia Mathews is a 24 y.o. female here for a new problem.  History of Present Illness:   Chief Complaint  Patient presents with  . Contraception    Has had her IUD x 2 months. Severe pain. Had a ultrasound done at GYN and eveything was fine. Was advised to follow up with [place]. Had the same issue with her previous IUD     HPI   Pelvic pain Patient has recently gotten a Skyla IUD placed about 2 months ago. Had pain after insertion. Had a ultrasound done at GYN and eveything was fine, IUD placed. Was advised to follow up with [place]. Had the same issue with her previous IUD that resolved after removal (had Mirena at that time.) Has pain with deep penetration sex. Having some vaginal spotting, but thinks that this may be related to recent IUD insertion. Denies concerns with constipation.  HSV-1 Has been having increased cold sores since covid vaccine. Needs updated valtrex script.   Past Medical History:  Diagnosis Date  . Asthma, childhood, exercise-induced   . Depression      Social History   Tobacco Use  . Smoking status: Never Smoker  . Smokeless tobacco: Never Used  Substance Use Topics  . Alcohol use: Yes    Comment: OCC  . Drug use: Never    History reviewed. No pertinent surgical history.  Family History  Problem Relation Age of Onset  . Hypothyroidism Mother   . Parkinson's disease Maternal Grandmother   . Lung cancer Maternal Grandmother   . Heart attack Maternal Grandfather   . Hyperlipidemia Maternal Grandfather   . Mental illness Maternal Grandfather   . Schizophrenia Maternal Grandfather   . Heart disease Maternal Grandfather   . Alcohol abuse Paternal Grandfather   . Mood Disorder Maternal Uncle   . Bipolar disorder Maternal Uncle   . Breast cancer Neg Hx   . Colon cancer Neg Hx     Allergies  Allergen Reactions  . Lamotrigine   . Amoxicillin Rash    Current Medications:   Current Outpatient Medications:  .  cyanocobalamin (,VITAMIN  B-12,) 1000 MCG/ML injection, 1000 mcg (1 mg) injection once per week for four weeks, followed by 1000 mcg injection twice per month., Disp: 1 mL, Rfl: 15 .  L-Methylfolate 15 MG TABS, Take 1 tablet (15 mg total) by mouth daily., Disp: 30 tablet, Rfl: 5 .  L-Methylfolate-Algae (DEPLIN 15) 15-90.314 MG CAPS, Take 15 mg by mouth daily., Disp: 90 capsule, Rfl: 1 .  Levonorgestrel (SKYLA) 13.5 MG IUD, by Intrauterine route., Disp: , Rfl:  .  SYRINGE-NEEDLE, DISP, 3 ML (B-D SYRINGE/NEEDLE 3CC/25GX5/8) 25G X 5/8" 3 ML MISC, 1 each by Does not apply route as needed., Disp: 12 each, Rfl: 0 .  Vitamin D, Ergocalciferol, (DRISDOL) 1.25 MG (50000 UNIT) CAPS capsule, Take 1 capsule (50,000 Units total) by mouth every 7 (seven) days., Disp: 8 capsule, Rfl: 0 .  valACYclovir (VALTREX) 1000 MG tablet, Take two tablets ( total 2000 mg) by mouth q12h x 1 day; Start: ASAP after symptom onset, Disp: 30 tablet, Rfl: 1   Review of Systems:   ROS  Negative unless otherwise specified per HPI.  Vitals:   Vitals:   07/30/19 0933  BP: 118/70  Pulse: 89  Resp: 18  Temp: 98.3 F (36.8 C)  TempSrc: Temporal  SpO2: 98%  Weight: 126 lb 12.8 oz (57.5 kg)  Height: 5\' 5"  (1.651 m)     Body mass index is 21.1 kg/m.  Physical Exam:   Physical Exam Vitals and nursing note reviewed.  Constitutional:      General: She is not in acute distress.    Appearance: She is well-developed. She is not ill-appearing or toxic-appearing.  Cardiovascular:     Rate and Rhythm: Normal rate and regular rhythm.     Pulses: Normal pulses.     Heart sounds: Normal heart sounds, S1 normal and S2 normal.     Comments: No LE edema Pulmonary:     Effort: Pulmonary effort is normal.     Breath sounds: Normal breath sounds.  Abdominal:     General: Abdomen is flat. Bowel sounds are normal.     Palpations: Abdomen is soft.  Musculoskeletal:     Comments: Very mild TTP to L inguinal area, no obvious bulge present  Skin:    General:  Skin is warm and dry.  Neurological:     Mental Status: She is alert.     GCS: GCS eye subscore is 4. GCS verbal subscore is 5. GCS motor subscore is 6.  Psychiatric:        Speech: Speech normal.        Behavior: Behavior normal. Behavior is cooperative.      Assessment and Plan:   Sylvia Mathews was seen today for contraception.  Diagnoses and all orders for this visit:  Pelvic pain No red flags on exam. No obvious explanation of pain. Briefly evaluated with Sedalia Muta, PT. Will refer patient to Pelvic Floor PT for further evaluation. If no improvement/explanation of symptoms -- will obtain further imaging/refer back to ob/gyn. -     Ambulatory referral to Physical Therapy  Cold sore Refill of valtrex today.  Other orders -     valACYclovir (VALTREX) 1000 MG tablet; Take two tablets ( total 2000 mg) by mouth q12h x 1 day; Start: ASAP after symptom onset    . Reviewed expectations re: course of current medical issues. . Discussed self-management of symptoms. . Outlined signs and symptoms indicating need for more acute intervention. . Patient verbalized understanding and all questions were answered. . See orders for this visit as documented in the electronic medical record. . Patient received an After-Visit Summary.   Jarold Motto, PA-C

## 2019-08-04 ENCOUNTER — Other Ambulatory Visit: Payer: Self-pay | Admitting: Physician Assistant

## 2019-08-04 ENCOUNTER — Encounter: Payer: Self-pay | Admitting: Physician Assistant

## 2019-08-04 DIAGNOSIS — R102 Pelvic and perineal pain: Secondary | ICD-10-CM

## 2019-08-07 ENCOUNTER — Other Ambulatory Visit: Payer: Self-pay | Admitting: Physician Assistant

## 2019-09-02 ENCOUNTER — Other Ambulatory Visit: Payer: Self-pay

## 2019-09-02 ENCOUNTER — Other Ambulatory Visit (INDEPENDENT_AMBULATORY_CARE_PROVIDER_SITE_OTHER): Payer: 59

## 2019-09-02 DIAGNOSIS — E538 Deficiency of other specified B group vitamins: Secondary | ICD-10-CM

## 2019-09-02 LAB — VITAMIN B12: Vitamin B-12: 312 pg/mL (ref 211–911)

## 2019-09-03 ENCOUNTER — Ambulatory Visit: Payer: 59 | Admitting: Physical Therapy

## 2019-09-22 ENCOUNTER — Encounter: Payer: Self-pay | Admitting: Physician Assistant

## 2019-09-22 ENCOUNTER — Other Ambulatory Visit: Payer: Self-pay

## 2019-09-22 ENCOUNTER — Other Ambulatory Visit (HOSPITAL_COMMUNITY)
Admission: RE | Admit: 2019-09-22 | Discharge: 2019-09-22 | Disposition: A | Discharge status: Home / Self Care | Payer: 59 | Source: Ambulatory Visit | Attending: Physician Assistant | Admitting: Physician Assistant

## 2019-09-22 ENCOUNTER — Ambulatory Visit: Payer: 59 | Admitting: Physician Assistant

## 2019-09-22 ENCOUNTER — Other Ambulatory Visit: Payer: Self-pay | Admitting: Physician Assistant

## 2019-09-22 VITALS — BP 118/66 | HR 84 | Temp 98.3°F | Ht 65.0 in | Wt 126.0 lb

## 2019-09-22 DIAGNOSIS — Z113 Encounter for screening for infections with a predominantly sexual mode of transmission: Secondary | ICD-10-CM | POA: Diagnosis not present

## 2019-09-22 DIAGNOSIS — R29818 Other symptoms and signs involving the nervous system: Secondary | ICD-10-CM

## 2019-09-22 DIAGNOSIS — R102 Pelvic and perineal pain unspecified side: Secondary | ICD-10-CM

## 2019-09-22 NOTE — Patient Instructions (Signed)
It was great to see you!  I will put in referral to Dr. Nira Conn and LaVoie's office for your IUD.  We will be in touch with ordering your MRI.  I will put in referral for neurology as well.  I will be in touch with labs when they return.  Take care,  Jarold Motto PA-C

## 2019-09-22 NOTE — Progress Notes (Signed)
Sylvia Mathews is a 24 y.o. female is here to discuss:  Pelvic pain  I acted as a Neurosurgeon for [employer], PA-C Sylvia Mull, LPN   History of Present Illness:   Chief Complaint  Patient presents with  . Pelvic Pain  . Vit B 12 deficiency    HPI   Pelvic pain Pt is still c/o pelvic pain with IUD. She has been doing Pelvic Floor PT x 6 weeks and little better. Physical therapy has recommended follow-up with ob-gyn. Has appointment scheduled with ob-gyn at later time this month. Was told that the pelvic floor muscles are quite tense.   Vit B12 deficiency; now with worsening neuro sx Pt says symptoms related to B12 being low are worse. Last B12 was 312 and was higher than previous dosages. She is having numbness and tingling down both legs and occasionally down arms. Trouble concentrating, motor skills issues -- having trouble buttoning pants x a few episodes, dropping pieces to puzzles, etc; she endorses ongoing fatigue and brain fog, some headaches and nausea.  Wt Readings from Last 3 Encounters:  09/22/19 126 lb (57.2 kg)  07/30/19 126 lb 12.8 oz (57.5 kg)  06/06/19 122 lb (55.3 kg)   She would also like STD screening as well today. Denies symptoms.   Health Maintenance Due  Topic Date Due  . Hepatitis C Screening  Never done    Past Medical History:  Diagnosis Date  . Asthma, childhood, exercise-induced   . Depression      Social History   Tobacco Use  . Smoking status: Never Smoker  . Smokeless tobacco: Never Used  Vaping Use  . Vaping Use: Never assessed  Substance Use Topics  . Alcohol use: Yes    Comment: OCC  . Drug use: Never    History reviewed. No pertinent surgical history.  Family History  Problem Relation Age of Onset  . Hypothyroidism Mother   . Parkinson's disease Maternal Grandmother   . Lung cancer Maternal Grandmother   . Heart attack Maternal Grandfather   . Hyperlipidemia Maternal Grandfather   . Mental illness  Maternal Grandfather   . Schizophrenia Maternal Grandfather   . Heart disease Maternal Grandfather   . Alcohol abuse Paternal Grandfather   . Mood Disorder Maternal Uncle   . Bipolar disorder Maternal Uncle   . Breast cancer Neg Hx   . Colon cancer Neg Hx     PMHx, SurgHx, SocialHx, FamHx, Medications, and Allergies were reviewed in the Visit Navigator and updated as appropriate.   Patient Active Problem List   Diagnosis Date Noted  . Heterozygous for MTHFR gene mutation 05/14/2019  . Pain in pelvis 09/23/2018  . Vegetarian 01/09/2018  . Vitamin D deficiency 01/09/2018  . B12 deficiency 01/09/2018  . Menorrhagia 11/28/2012    Social History   Tobacco Use  . Smoking status: Never Smoker  . Smokeless tobacco: Never Used  Vaping Use  . Vaping Use: Never assessed  Substance Use Topics  . Alcohol use: Yes    Comment: OCC  . Drug use: Never    Current Medications and Allergies:    Current Outpatient Medications:  .  cyanocobalamin (,VITAMIN B-12,) 1000 MCG/ML injection, 1000 mcg (1 mg) injection once per week for four weeks, followed by 1000 mcg injection twice per month., Disp: 1 mL, Rfl: 15 .  L-Methylfolate 15 MG TABS, Take 1 tablet (15 mg total) by mouth daily., Disp: 30 tablet, Rfl: 5 .  Levonorgestrel (SKYLA) 13.5 MG IUD,  by Intrauterine route., Disp: , Rfl:  .  Multiple Vitamin (MULTIVITAMIN) tablet, Take 1 tablet by mouth daily., Disp: , Rfl:  .  SYRINGE-NEEDLE, DISP, 3 ML (B-D SYRINGE/NEEDLE 3CC/25GX5/8) 25G X 5/8" 3 ML MISC, 1 each by Does not apply route as needed., Disp: 12 each, Rfl: 0 .  valACYclovir (VALTREX) 1000 MG tablet, Take two tablets ( total 2000 mg) by mouth q12h x 1 day; Start: ASAP after symptom onset, Disp: 30 tablet, Rfl: 1 .  Vitamin D, Ergocalciferol, 50 MCG (2000 UT) CAPS, Take 1 capsule by mouth daily., Disp: , Rfl:    Allergies  Allergen Reactions  . Lamotrigine   . Amoxicillin Rash    Review of Systems   ROS  Negative unless  otherwise specified per HPI.  Vitals:   Vitals:   09/22/19 1509  BP: 118/66  Pulse: 84  Temp: 98.3 F (36.8 C)  TempSrc: Temporal  SpO2: 97%  Weight: 126 lb (57.2 kg)  Height: 5\' 5"  (1.651 m)     Body mass index is 20.97 kg/m.   Physical Exam:    Physical Exam Vitals and nursing note reviewed.  Constitutional:      General: She is not in acute distress.    Appearance: She is well-developed. She is not ill-appearing or toxic-appearing.  Cardiovascular:     Rate and Rhythm: Normal rate and regular rhythm.     Pulses: Normal pulses.     Heart sounds: Normal heart sounds, S1 normal and S2 normal.     Comments: No LE edema Pulmonary:     Effort: Pulmonary effort is normal.     Breath sounds: Normal breath sounds.  Skin:    General: Skin is warm and dry.  Neurological:     General: No focal deficit present.     Mental Status: She is alert.     GCS: GCS eye subscore is 4. GCS verbal subscore is 5. GCS motor subscore is 6.     Cranial Nerves: Cranial nerves are intact.     Sensory: Sensation is intact.     Motor: Motor function is intact.     Coordination: Coordination is intact. Romberg sign negative.     Gait: Gait is intact.  Psychiatric:        Speech: Speech normal.        Behavior: Behavior normal. Behavior is cooperative.      Assessment and Plan:    Sylvia Mathews was seen today for pelvic pain and vit b 12 deficiency.  Diagnoses and all orders for this visit:  Neurological deficit, transient Unclear etiology. Will order MRI brain for further evaluation. Referral to neurology placed. Will also update labs.  Further intervention based on results. -     CBC with Differential/Platelet; Future -     Cancel: Comprehensive metabolic panel; Future -     Cancel: TSH; Future -     MR Brain W Wo Contrast; Future -     TSH; Future -     Comprehensive metabolic panel; Future -     Comprehensive metabolic panel -     TSH -     CBC with  Differential/Platelet  Screening examination for STD (sexually transmitted disease) -     HIV Antibody (routine testing w rflx); Future -     Cancel: RPR; Future -     Urine cytology ancillary only([facility]) -     RPR; Future -     RPR -     HIV Antibody (  routine testing w rflx)  Pelvic pain Referral to gynecology. -     Ambulatory referral to Gynecology  . Reviewed expectations re: course of current medical issues. . Discussed self-management of symptoms. . Outlined signs and symptoms indicating need for more acute intervention. . Patient verbalized understanding and all questions were answered. . See orders for this visit as documented in the electronic medical record. . Patient received an After Visit Summary.  CMA or LPN served as scribe during this visit. History, Physical, and Plan performed by medical provider. The above documentation has been reviewed and is accurate and complete.  Time spent with patient today was 25 minutes which consisted of chart review, discussing diagnosis, work up, treatment answering questions and documentation.   Jarold Motto, PA-C [place] [place] 09/22/2019  Follow-up: No follow-ups on file.

## 2019-09-23 ENCOUNTER — Ambulatory Visit (HOSPITAL_COMMUNITY)
Admission: RE | Admit: 2019-09-23 | Discharge: 2019-09-23 | Disposition: A | Discharge status: Home / Self Care | Payer: 59 | Source: Ambulatory Visit | Attending: Physician Assistant | Admitting: Physician Assistant

## 2019-09-23 DIAGNOSIS — R29818 Other symptoms and signs involving the nervous system: Secondary | ICD-10-CM | POA: Diagnosis not present

## 2019-09-23 LAB — CBC WITH DIFFERENTIAL/PLATELET
Absolute Monocytes: 390 cells/uL (ref 200–950)
Basophils Absolute: 33 cells/uL (ref 0–200)
Basophils Relative: 0.4 %
Eosinophils Absolute: 8 cells/uL — ABNORMAL LOW (ref 15–500)
Eosinophils Relative: 0.1 %
HCT: 38.5 % (ref 35.0–45.0)
Hemoglobin: 12.4 g/dL (ref 11.7–15.5)
Lymphs Abs: 2083 cells/uL (ref 850–3900)
MCH: 28.9 pg (ref 27.0–33.0)
MCHC: 32.2 g/dL (ref 32.0–36.0)
MCV: 89.7 fL (ref 80.0–100.0)
MPV: 12.7 fL — ABNORMAL HIGH (ref 7.5–12.5)
Monocytes Relative: 4.7 %
Neutro Abs: 5785 cells/uL (ref 1500–7800)
Neutrophils Relative %: 69.7 %
Platelets: 264 10*3/uL (ref 140–400)
RBC: 4.29 10*6/uL (ref 3.80–5.10)
RDW: 12.7 % (ref 11.0–15.0)
Total Lymphocyte: 25.1 %
WBC: 8.3 10*3/uL (ref 3.8–10.8)

## 2019-09-23 LAB — COMPREHENSIVE METABOLIC PANEL
AG Ratio: 1.7 (calc) (ref 1.0–2.5)
ALT: 19 U/L (ref 6–29)
AST: 22 U/L (ref 10–30)
Albumin: 4.5 g/dL (ref 3.6–5.1)
Alkaline phosphatase (APISO): 74 U/L (ref 31–125)
BUN: 8 mg/dL (ref 7–25)
CO2: 25 mmol/L (ref 20–32)
Calcium: 9.7 mg/dL (ref 8.6–10.2)
Chloride: 105 mmol/L (ref 98–110)
Creat: 0.71 mg/dL (ref 0.50–1.10)
Globulin: 2.6 g/dL (calc) (ref 1.9–3.7)
Glucose, Bld: 84 mg/dL (ref 65–99)
Potassium: 4.1 mmol/L (ref 3.5–5.3)
Sodium: 139 mmol/L (ref 135–146)
Total Bilirubin: 1 mg/dL (ref 0.2–1.2)
Total Protein: 7.1 g/dL (ref 6.1–8.1)

## 2019-09-23 LAB — TSH: TSH: 0.72 mIU/L

## 2019-09-23 LAB — HIV ANTIBODY (ROUTINE TESTING W REFLEX): HIV 1&2 Ab, 4th Generation: NONREACTIVE

## 2019-09-23 LAB — RPR: RPR Ser Ql: NONREACTIVE

## 2019-09-23 MED ORDER — GADOBUTROL 1 MMOL/ML IV SOLN
5.0000 mL | Freq: Once | INTRAVENOUS | Status: AC | PRN
  Administered 2019-09-23: 5 mL via INTRAVENOUS

## 2019-09-23 NOTE — Progress Notes (Signed)
[facility] Clinic Note - Initial Visit   Date: 09/25/19  Sylvia Mathews MRN: [id number] DOB: 1995/11/02   Dear Jarold Motto, PA:  Thank you for your kind referral of Sylvia Mathews [facility] for consultation of tingling. Although her history is well known to you, please allow [place] to reiterate it for the purpose of our medical record. The patient was accompanied to the clinic by self.    History of Present Illness: Sylvia Mathews is a 24 y.o. right-handed female presenting for evaluation of generalized tingling. Starting in January 2021, she began having tingling over the back of the thighs and lower legs, feet, which has progressed into the arms and hands.  It occurs about 4-5 times per day, lasting 15-20 min.  No specific triggers or alleviating factors. She feels that her hands are clumsy and weak.  No weakness in the legs.  Her labs indicate she has been B12 deficiency for the past year and started on injections this year.  Because of worsening symptoms, MRI brain was ordered and is normal.    Out-side paper records, electronic medical record, and images have been reviewed where available and summarized as:  MRI brain wwo contrast 09/23/2019:  Unremarkable MRI appearance of the brain. No evidence of acute intracranial abnormality.  Lab Results  Component Value Date   VITAMINB12 312 09/02/2019   Lab Results  Component Value Date   TSH 0.72 09/22/2019    Past Medical History:  Diagnosis Date   Asthma, childhood, exercise-induced    Bipolar 2 disorder (HCC)     Past Surgical History:  Procedure Laterality Date   WISDOM TOOTH EXTRACTION       Medications:  Outpatient Encounter Medications as of 09/25/2019  Medication Sig   cyanocobalamin (,VITAMIN B-12,) 1000 MCG/ML injection 1000 mcg (1 mg) injection once per week for four weeks, followed by 1000 mcg injection twice per month.   ferrous sulfate 325 (65 FE) MG EC tablet  Take 325 mg by mouth 3 (three) times daily with meals.   L-Methylfolate 15 MG TABS Take 1 tablet (15 mg total) by mouth daily.   Levonorgestrel (SKYLA) 13.5 MG IUD by Intrauterine route.   Multiple Vitamin (MULTIVITAMIN) tablet Take 1 tablet by mouth daily.   SYRINGE-NEEDLE, DISP, 3 ML (B-D SYRINGE/NEEDLE 3CC/25GX5/8) 25G X 5/8" 3 ML MISC 1 each by Does not apply route as needed.   valACYclovir (VALTREX) 1000 MG tablet Take two tablets ( total 2000 mg) by mouth q12h x 1 day; Start: ASAP after symptom onset   Vitamin D, Ergocalciferol, 50 MCG (2000 UT) CAPS Take 1 capsule by mouth daily.   No facility-administered encounter medications on file as of 09/25/2019.    Allergies:  Allergies  Allergen Reactions   Lamotrigine    Amoxicillin Rash    Family History: Family History  Problem Relation Age of Onset   Hypothyroidism Mother    Parkinson's disease Maternal Grandmother    Lung cancer Maternal Grandmother    Heart attack Maternal Grandfather    Hyperlipidemia Maternal Grandfather    Mental illness Maternal Grandfather    Schizophrenia Maternal Grandfather    Heart disease Maternal Grandfather    Alcohol abuse Paternal Grandfather    Mood Disorder Maternal Uncle    Bipolar disorder Maternal Uncle    Breast cancer Neg Hx    Colon cancer Neg Hx     Social History: Social History   Tobacco Use   Smoking status: Never Smoker   Smokeless  tobacco: Never Used  Vaping Use   Vaping Use: Never assessed  Substance Use Topics   Alcohol use: Yes    Comment: OCC   Drug use: Never   Social History   Social History Narrative   RN at [employer], works with new moms.   She lives alone, no children.     Vital Signs:  BP 117/76    Pulse 96    Ht 5\' 6"  (1.676 m)    Wt 127 lb (57.6 kg)    LMP 09/06/2019    SpO2 98%    BMI 20.50 kg/m   Neurological Exam: MENTAL STATUS including orientation to time, place, person, recent and remote memory, attention  span and concentration, language, and fund of knowledge is normal.  Speech is not dysarthric.  CRANIAL NERVES: II:  No visual field defects.  Unremarkable fundi.   III-IV-VI: Pupils equal round and reactive to light.  Normal conjugate, extra-ocular eye movements in all directions of gaze.  No nystagmus.  No ptosis.   V:  Normal facial sensation.    VII:  Normal facial symmetry and movements.   VIII:  Normal hearing and vestibular function.   IX-X:  Normal palatal movement.   XI:  Normal shoulder shrug and head rotation.   XII:  Normal tongue strength and range of motion, no deviation or fasciculation.  MOTOR:  No atrophy or fasciculations.  Hands and legs are tremulous, worse with strength testing.  Absent at rest.  No pronator drift.   Upper Extremity:  Right  Left  Deltoid  5/5   5/5   Biceps  5/5   5/5   Triceps  5/5   5/5   Infraspinatus 5/5  5/5  Medial pectoralis 5/5  5/5  Wrist extensors  5/5   5/5   Wrist flexors  5/5   5/5   Finger extensors  5/5   5/5   Finger flexors  5/5   5/5   Dorsal interossei  5/5   5/5   Abductor pollicis  5/5   5/5   Tone (Ashworth scale)  0  0   Lower Extremity:  Right  Left  Hip flexors  5/5   5/5   Hip extensors  5/5   5/5   Adductor 5/5  5/5  Abductor 5/5  5/5  Knee flexors  5/5   5/5   Knee extensors  5/5   5/5   Dorsiflexors  5/5   5/5   Plantarflexors  5/5   5/5   Toe extensors  5/5   5/5   Toe flexors  5/5   5/5   Tone (Ashworth scale)  0  0   MSRs:  Right        Left                  brachioradialis 2+  2+  biceps 2+  2+  triceps 2+  2+  patellar 2+  2+  ankle jerk 2+  2+  Hoffman no  no  plantar response down  down   SENSORY: Subjective reduced pin prick in the feet, vibration and temperature is normal bilaterally. Romberg's sign absent.   COORDINATION/GAIT: Normal finger-to- nose-finger.  Intact rapid alternating movements bilaterally. Gait narrow based and stable. Tandem and stressed gait intact.    IMPRESSION: 1.  Migratory paresthesias involving the arms and legs due to vitamin B12 deficiency.  No worrisome signs on exam.  MRI brain was personally viewed and normal.  Patient  reassured that I did not see anything worrisome and that nerve-related symptoms will take months to improve, even after the B12 level is normalized. If symptoms get worse, she will call the office and the next step is NCS/EMG of the arm and leg (worse side)  2. Anxiety/depression.  She is very tremulous which is common when there is underlying anxiety.  She is established with a therapist and psychiatry.   Thank you for allowing me to participate in patient's care.  If I can answer any additional questions, I would be pleased to do so.    Sincerely,    Meia Emley K. Allena Katz, DO

## 2019-09-24 LAB — URINE CYTOLOGY ANCILLARY ONLY
Chlamydia: NEGATIVE
Comment: NEGATIVE
Comment: NEGATIVE
Comment: NORMAL
Neisseria Gonorrhea: NEGATIVE
Trichomonas: NEGATIVE

## 2019-09-25 ENCOUNTER — Ambulatory Visit: Payer: 59 | Admitting: Neurology

## 2019-09-25 ENCOUNTER — Encounter: Payer: Self-pay | Admitting: Neurology

## 2019-09-25 ENCOUNTER — Other Ambulatory Visit: Payer: Self-pay

## 2019-09-25 VITALS — BP 117/76 | HR 96 | Ht 66.0 in | Wt 127.0 lb

## 2019-09-25 DIAGNOSIS — R202 Paresthesia of skin: Secondary | ICD-10-CM | POA: Diagnosis not present

## 2019-09-25 DIAGNOSIS — E538 Deficiency of other specified B group vitamins: Secondary | ICD-10-CM | POA: Diagnosis not present

## 2019-09-25 NOTE — Patient Instructions (Addendum)
If you choose to proceed with nerve testing, please call the office to schedule  ELECTROMYOGRAM AND NERVE CONDUCTION STUDIES (EMG/NCS) INSTRUCTIONS  How to Prepare The neurologist conducting the EMG will need to know if you have certain medical conditions. Tell the neurologist and other EMG lab personnel if you: . Have a pacemaker or any other electrical medical device . Take blood-thinning medications . Have hemophilia, a blood-clotting disorder that causes prolonged bleeding Bathing Take a shower or bath shortly before your exam in order to remove oils from your skin. Don't apply lotions or creams before the exam.  What to Expect You'll likely be asked to change into a hospital gown for the procedure and lie down on an examination table. The following explanations can help you understand what will happen during the exam.  . Electrodes. The neurologist or a technician places surface electrodes at various locations on your skin depending on where you're experiencing symptoms. Or the neurologist may insert needle electrodes at different sites depending on your symptoms.  . Sensations. The electrodes will at times transmit a tiny electrical current that you may feel as a twinge or spasm. The needle electrode may cause discomfort or pain that usually ends shortly after the needle is removed. If you are concerned about discomfort or pain, you may want to talk to the neurologist about taking a short break during the exam.  . Instructions. During the needle EMG, the neurologist will assess whether there is any spontaneous electrical activity when the muscle is at rest - activity that isn't present in healthy muscle tissue - and the degree of activity when you slightly contract the muscle.  He or she will give you instructions on resting and contracting a muscle at appropriate times. Depending on what muscles and nerves the neurologist is examining, he or she may ask you to change positions during the  exam.  After your EMG You may experience some temporary, minor bruising where the needle electrode was inserted into your muscle. This bruising should fade within several days. If it persists, contact your primary care doctor.

## 2019-09-29 ENCOUNTER — Ambulatory Visit: Payer: 59 | Admitting: Obstetrics and Gynecology

## 2019-09-29 ENCOUNTER — Other Ambulatory Visit: Payer: Self-pay

## 2019-09-29 ENCOUNTER — Encounter: Payer: Self-pay | Admitting: Obstetrics and Gynecology

## 2019-09-29 ENCOUNTER — Ambulatory Visit: Payer: Self-pay | Admitting: Obstetrics and Gynecology

## 2019-09-29 VITALS — BP 100/64 | HR 71 | Ht 65.0 in | Wt 125.0 lb

## 2019-09-29 DIAGNOSIS — Z30432 Encounter for removal of intrauterine contraceptive device: Secondary | ICD-10-CM | POA: Diagnosis not present

## 2019-09-29 DIAGNOSIS — B9689 Other specified bacterial agents as the cause of diseases classified elsewhere: Secondary | ICD-10-CM | POA: Diagnosis not present

## 2019-09-29 DIAGNOSIS — N760 Acute vaginitis: Secondary | ICD-10-CM

## 2019-09-29 DIAGNOSIS — Z8659 Personal history of other mental and behavioral disorders: Secondary | ICD-10-CM

## 2019-09-29 DIAGNOSIS — R102 Pelvic and perineal pain: Secondary | ICD-10-CM | POA: Diagnosis not present

## 2019-09-29 DIAGNOSIS — Z3009 Encounter for other general counseling and advice on contraception: Secondary | ICD-10-CM

## 2019-09-29 MED ORDER — ETONOGESTREL-ETHINYL ESTRADIOL 0.12-0.015 MG/24HR VA RING
1.0000 | VAGINAL_RING | VAGINAL | 2 refills | Status: DC
Start: 2019-09-29 — End: 2020-06-22

## 2019-09-29 MED ORDER — METRONIDAZOLE 500 MG PO TABS
500.0000 mg | ORAL_TABLET | Freq: Two times a day (BID) | ORAL | 0 refills | Status: DC
Start: 2019-09-29 — End: 2019-10-31

## 2019-09-29 NOTE — Progress Notes (Signed)
24 y.o. G0P0000 Single White or Caucasian Not Hispanic or Latino female here for a consultation from [place], [place] for evaluation of pelvic pain.  She is complaining of pelvic pain that radiates to the vagina. She is also having Vaginal discharge and bleeding.   She has a skyla IUD, placed in 3/21. She previously had a mirena and had to have it removed secondary to pain. The mirena for 1.5 years, pain the whole time. She was without insurance so couldn't get it removed.   She c/o pelvic pain since skyla insertion. Initially the pain was constant, 5-8/10 in severity. Slowly improved with time and pelvic floor physical therapy. Now the pain is coming 1-2 x a days for 10-15 minutes, sometimes lasts for hours. The pain is sharp shooting pain. Can ranges from a 3-8/10 in severity. New partner in the last month. No dyspareunia. Hurts after sex. She does have some cramping with orgasm. Using condoms. She is having spotting every time she has sex.  She c/o an increase in white vaginal d/c, creamy to watery. No itching, burning, irritation or odor.   Prior to the skyla her cycles were regular. Since the skyla was inserted her cycles have been every 3-5 weeks. She has random spotting in addition to spotting with intercourse and PT.   No bowel or bladder issues.   Ultrasound 07/16/19: IUD in place, normal ultrasound. Ultrasound images reviewed.  Lab work from 09/22/19: normal CBC, normal CMP, normal TSH, negative HIV, negative RPR, negative GC/CT/Trich  Period Cycle (Days):  (20 to 30 day cycles) Period Duration (Days): 3-7 Period Pattern: (!) Irregular Menstrual Flow: Moderate Menstrual Control:  (Diva Cup) Dysmenorrhea: (!) Moderate Dysmenorrhea Symptoms: Nausea, Headache, Cramping  Patient's last menstrual period was 09/06/2019.          Sexually active: Yes.    The current method of family planning is IUD.    Exercising: No.  The patient has a physically strenuous job, but has no regular  exercise apart from work.  Smoker:  no  Health Maintenance: Pap:  09/23/18 normal.  History of abnormal Pap:  no TDaP:  utd per patient  Gardasil: complete 2017   reports that she has never smoked. She has never used smokeless tobacco. She reports current alcohol use. She reports that she does not use drugs. Rare ETOH. 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   Past Medical History:  Diagnosis Date  . Abnormal uterine bleeding   . Anemia   . Anxiety   . Asthma, childhood, exercise-induced   . Bipolar 2 disorder (HCC)   . Depression   . Dysmenorrhea     Past Surgical History:  Procedure Laterality Date  . WISDOM TOOTH EXTRACTION      Current Outpatient Medications  Medication Sig Dispense Refill  . cyanocobalamin (,VITAMIN B-12,) 1000 MCG/ML injection 1000 mcg (1 mg) injection once per week for four weeks, followed by 1000 mcg injection twice per month. 1 mL 15  . ferrous sulfate 325 (65 FE) MG EC tablet Take 325 mg by mouth 3 (three) times daily with meals.    [name] L-Methylfolate 15 MG TABS Take 1 tablet (15 mg total) by mouth daily. 30 tablet 5  . Levonorgestrel (SKYLA) 13.5 MG IUD by Intrauterine route.    . Multiple Vitamin (MULTIVITAMIN) tablet Take 1 tablet by mouth daily.    . SYRINGE-NEEDLE, DISP, 3 ML (B-D SYRINGE/NEEDLE 3CC/25GX5/8) 25G X 5/8" 3 ML  MISC 1 each by Does not apply route as needed. 12 each 0  . valACYclovir (VALTREX) 1000 MG tablet Take two tablets ( total 2000 mg) by mouth q12h x 1 day; Start: ASAP after symptom onset 30 tablet 1  . Vitamin D, Ergocalciferol, 50 MCG (2000 UT) CAPS Take 1 capsule by mouth daily.     No current facility-administered medications for this visit.    Family History  Problem Relation Age of Onset  . Hypothyroidism Mother   . Parkinson's disease Maternal Grandmother   . Lung cancer Maternal Grandmother   . Heart attack Maternal Grandfather   . Hyperlipidemia Maternal  Grandfather   . Mental illness Maternal Grandfather   . Schizophrenia Maternal Grandfather   . Heart disease Maternal Grandfather   . Alcohol abuse Paternal Grandfather   . Mood Disorder Maternal Uncle   . Bipolar disorder Maternal Uncle   . Breast cancer Neg Hx   . Colon cancer Neg Hx     Review of Systems  Genitourinary: Positive for pelvic pain, vaginal bleeding, vaginal discharge and vaginal pain.  Psychiatric/Behavioral: Positive for dysphoric mood. The patient is nervous/anxious.   All other systems reviewed and are negative. She is seeing Neurology for numbness and tingling in her extremities, normal MRI, thinks it's from vit B 12 def (being replaced).  Exam:   BP 100/64   Pulse 71   Ht 5\' 5"  (1.651 m)   Wt 125 lb (56.7 kg)   LMP 09/06/2019   SpO2 99%   BMI 20.80 kg/m   Weight change: @WEIGHTCHANGE @ Height:   Height: 5\' 5"  (165.1 cm)  Ht Readings from Last 3 Encounters:  09/29/19 5\' 5"  (1.651 m)  09/25/19 5\' 6"  (1.676 m)  09/22/19 5\' 5"  (1.651 m)    General appearance: alert, cooperative and appears stated age Head: Normocephalic, without obvious abnormality, atraumatic Neck: no adenopathy, supple, symmetrical, trachea midline and thyroid normal to inspection and palpation Abdomen: soft, non-tender; non distended,  no masses,  no organomegaly Extremities: extremities normal, atraumatic, no cyanosis or edema Skin: Skin color, texture, turgor normal. No rashes or lesions No abnormal inguinal nodes palpated Neurologic: Grossly normal   Pelvic: External genitalia:  no lesions              Urethra:  normal appearing urethra with no masses, tenderness or lesions              Bartholins and Skenes: normal                 Vagina: normal appearing vagina with normal color and discharge, no lesions              Cervix: no lesions and equivocal cervical motion tenderness, IUD strings 3 cm. IUD removed with ringed forceps.               Bimanual Exam:  Uterus:  anteverted,  mobile, normal sized, mildly tender.              Adnexa: no mass, fullness, tenderness   Pelvic floor: not significantly tender.                  11/29/19 chaperoned for the exam.  Wet prep: ++ clue, no trich, few wbc KOH: no yeast PH: 5    A:  Pelvic pain since IUD placed in 3/21. Negative STD testing, normal pelvic ultrasound. Some help with pelvic floor PT.  Contraception, reviewed all of her options  H/O Depression, worried about  effects of hormones  Bacterial vaginitis   P:   IUD removed  Start the nuvaring, she will call if she feels her mood is worsening. No contraindication, risks reviewed  Handout on nuvaring given, discussed use  Use condoms for STD protection  Flagyl for BV, no ETOH   CC: Jarold Motto Note sent

## 2019-09-29 NOTE — Patient Instructions (Addendum)
Vaginitis Vaginitis is a condition in which the vaginal tissue swells and becomes red (inflamed). This condition is most often caused by a change in the normal balance of bacteria and yeast that live in the vagina. This change causes an overgrowth of certain bacteria or yeast, which causes the inflammation. There are different types of vaginitis, but the most common types are:  Bacterial vaginosis.  Yeast infection (candidiasis).  Trichomoniasis vaginitis. This is a sexually transmitted disease (STD).  Viral vaginitis.  Atrophic vaginitis.  Allergic vaginitis. What are the causes? The cause of this condition depends on the type of vaginitis. It can be caused by:  Bacteria (bacterial vaginosis).  Yeast, which is a fungus (yeast infection).  A parasite (trichomoniasis vaginitis).  A virus (viral vaginitis).  Low hormone levels (atrophic vaginitis). Low hormone levels can occur during pregnancy, breastfeeding, or after menopause.  Irritants, such as bubble baths, scented tampons, and feminine sprays (allergic vaginitis). Other factors can change the normal balance of the yeast and bacteria that live in the vagina. These include:  Antibiotic medicines.  Poor hygiene.  Diaphragms, vaginal sponges, spermicides, birth control pills, and intrauterine devices (IUD).  Sex.  Infection.  Uncontrolled diabetes.  A weakened defense (immune) system. What increases the risk? This condition is more likely to develop in women who:  Smoke.  Use vaginal douches, scented tampons, or scented sanitary pads.  Wear tight-fitting pants.  Wear thong underwear.  Use oral birth control pills or an IUD.  Have sex without a condom.  Have multiple sex partners.  Have an STD.  Frequently use the spermicide nonoxynol-9.  Eat lots of foods high in sugar.  Have uncontrolled diabetes.  Have low estrogen levels.  Have a weakened immune system from an immune disorder or medical  treatment.  Are pregnant or breastfeeding. What are the signs or symptoms? Symptoms vary depending on the cause of the vaginitis. Common symptoms include:  Abnormal vaginal discharge. ? The discharge is white, gray, or yellow with bacterial vaginosis. ? The discharge is thick, white, and cheesy with a yeast infection. ? The discharge is frothy and yellow or greenish with trichomoniasis.  A bad vaginal smell. The smell is fishy with bacterial vaginosis.  Vaginal itching, pain, or swelling.  Sex that is painful.  Pain or burning when urinating. Sometimes there are no symptoms. How is this diagnosed? This condition is diagnosed based on your symptoms and medical history. A physical exam, including a pelvic exam, will also be done. You may also have other tests, including:  Tests to determine the pH level (acidity or alkalinity) of your vagina.  A whiff test, to assess the odor that results when a sample of your vaginal discharge is mixed with a potassium hydroxide solution.  Tests of vaginal fluid. A sample will be examined under a microscope. How is this treated? Treatment varies depending on the type of vaginitis you have. Your treatment may include:  Antibiotic creams or pills to treat bacterial vaginosis and trichomoniasis.  Antifungal medicines, such as vaginal creams or suppositories, to treat a yeast infection.  Medicine to ease discomfort if you have viral vaginitis. Your sexual partner should also be treated.  Estrogen delivered in a cream, pill, suppository, or vaginal ring to treat atrophic vaginitis. If vaginal dryness occurs, lubricants and moisturizing creams may help. You may need to avoid scented soaps, sprays, or douches.  Stopping use of a product that is causing allergic vaginitis. Then using a vaginal cream to treat the symptoms. Follow  these instructions at home: Lifestyle  Keep your genital area clean and dry. Avoid soap, and only rinse the area with  water.  Do not douche or use tampons until your health care provider says it is okay to do so. Use sanitary pads, if needed.  Do not have sex until your health care provider approves. When you can return to sex, practice safe sex and use condoms.  Wipe from front to back. This avoids the spread of bacteria from the rectum to the vagina. General instructions  Take over-the-counter and prescription medicines only as told by your health care provider.  If you were prescribed an antibiotic medicine, take or use it as told by your health care provider. Do not stop taking or using the antibiotic even if you start to feel better.  Keep all follow-up visits as told by your health care provider. This is important. How is this prevented?  Use mild, non-scented products. Do not use things that can irritate the vagina, such as fabric softeners. Avoid the following products if they are scented: ? Feminine sprays. ? Detergents. ? Tampons. ? Feminine hygiene products. ? Soaps or bubble baths.  Let air reach your genital area. ? Wear cotton underwear to reduce moisture buildup. ? Avoid wearing underwear while you sleep. ? Avoid wearing tight pants and underwear or nylons without a cotton panel. ? Avoid wearing thong underwear.  Take off any wet clothing, such as bathing suits, as soon as possible.  Practice safe sex and use condoms. Contact a health care provider if:  You have abdominal pain.  You have a fever.  You have symptoms that last for more than 2-3 days. Get help right away if:  You have a fever and your symptoms suddenly get worse. Summary  Vaginitis is a condition in which the vaginal tissue becomes inflamed.This condition is most often caused by a change in the normal balance of bacteria and yeast that live in the vagina.  Treatment varies depending on the type of vaginitis you have.  Do not douche, use tampons , or have sex until your health care provider approves. When  you can return to sex, practice safe sex and use condoms. This information is not intended to replace advice given to you by your health care provider. Make sure you discuss any questions you have with your health care provider. Document Revised: 01/19/2017 Document Reviewed: 03/14/2016 Elsevier Patient Education  2020 Else[employer]. Ethinyl Estradiol; Etonogestrel vaginal ring What is this medicine? ETHINYL ESTRADIOL; ETONOGESTREL (ETH in il es tra DYE ole; et oh noe JES trel) vaginal ring is a flexible, vaginal ring used as a contraceptive (birth control method). This medicine combines 2 types of female hormones, an estrogen and a progestin. This ring is used to prevent ovulation and pregnancy. Each ring is effective for 1 month. This medicine may be used for other purposes; ask your health care provider or pharmacist if you have questions. COMMON BRAND NAME(S): EluRyng, NuvaRing What should I tell my health care provider before I take this medicine? They need to know if you have any of these conditions:  abnormal vaginal bleeding  blood vessel disease or blood clots  breast, cervical, endometrial, ovarian, liver, or uterine cancer  diabetes  gallbladder disease  having surgery  heart disease or recent heart attack  high blood pressure  high cholesterol or triglycerides  history of irregular heartbeat or heart valve problems  kidney disease  liver disease  migraine headaches  protein C deficiency  protein S deficiency  recently had a baby, miscarriage, or abortion  stroke  systemic lupus erythematosus (SLE)  tobacco smoker  your age is more than 25 years old  an unusual or allergic reaction to estrogens, progestins, other medicines, foods, dyes, or preservatives  pregnant or trying to get pregnant  breast-feeding How should I use this medicine? Insert the ring into your vagina as directed. Follow the directions on the prescription label. The ring will  remain place for 3 weeks and is then removed for a 1-week break. A new ring is inserted 1 week after the last ring was removed, on the same day of the week. Check often to make sure the ring is still in place. If the ring was out of the vagina for an unknown amount of time, you may not be protected from pregnancy. Perform a pregnancy test and call your doctor. Do not use more often than directed. A patient package insert for the product will be given with each prescription and refill. Read this sheet carefully each time. The sheet may change frequently. Contact your pediatrician regarding the use of this medicine in children. Special care may be needed. Overdosage: If you think you have taken too much of this medicine contact a poison control center or emergency room at once. NOTE: This medicine is only for you. Do not share this medicine with others. What if I miss a dose? You will need to use the ring exactly as directed. It is very important to follow the schedule every cycle. If you do not use the ring as directed, you may not be protected from pregnancy. If the ring should slip out, is lost, or if you leave it in longer or shorter than you should, contact your health care professional for advice. What may interact with this medicine? Do not take this medicine with the following medications:  dasabuvir; ombitasvir; paritaprevir; ritonavir  ombitasvir; paritaprevir; ritonavir  vaginal lubricants or other vaginal products that are oil-based or silicone-based This medicine may also interact with the following medications:  acetaminophen  antibiotics or medicines for infections, especially rifampin, rifabutin, rifapentine, and griseofulvin, and possibly penicillins or tetracyclines  aprepitant or fosaprepitant  armodafinil  ascorbic acid (vitamin C)  barbiturate medicines, such as phenobarbital or primidone  bosentan  certain antiviral medicines for hepatitis, HIV or AIDS  certain  medicines for cancer treatment  certain medicines for seizures like carbamazepine, clobazam, felbamate, lamotrigine, oxcarbazepine, phenytoin, rufinamide, topiramate  certain medicines for treating high cholesterol  cyclosporine  dantrolene  elagolix  flibanserin  grapefruit juice  lesinurad  medicines for diabetes  medicines to treat fungal infections, such as griseofulvin, miconazole, fluconazole, ketoconazole, itraconazole, posaconazole or voriconazole  mifepristone  mitotane  modafinil  morphine  mycophenolate  St. John's wort  tamoxifen  temazepam  theophylline or aminophylline  thyroid hormones  tizanidine  tranexamic acid  ulipristal  warfarin This list may not describe all possible interactions. Give your health care provider a list of all the medicines, herbs, non-prescription drugs, or dietary supplements you use. Also tell them if you smoke, drink alcohol, or use illegal drugs. Some items may interact with your medicine. What should I watch for while using this medicine? Visit your doctor or health care professional for regular checks on your progress. You will need a regular breast and pelvic exam and Pap smear while on this medicine. Check with your doctor or health care professional to see if you need an additional method of contraception during the first  cycle that you use this ring. Female condoms (made with natural rubber latex, polyisoprene, and polyurethane) and spermicides may be used. Do not use a diaphragm, cervical cap, or a female condom, as the ring can interfere with these birth control methods and their proper placement. If you have any reason to think you are pregnant, stop using this medicine right away and contact your doctor or health care professional. If you are using this medicine for hormone related problems, it may take several cycles of use to see improvement in your condition. Smoking increases the risk of getting a blood clot  or having a stroke while you are using hormonal birth control, especially if you are more than 25 years old. You are strongly advised not to smoke. Some women are prone to getting dark patches on the skin of the face (cholasma). Your risk of getting chloasma with this medicine is higher if you had chloasma during a pregnancy. Keep out of the sun. If you cannot avoid being in the sun, wear protective clothing and use sunscreen. Do not use sun lamps or tanning beds/booths. This medicine can make your body retain fluid, making your fingers, hands, or ankles swell. Your blood pressure can go up. Contact your doctor or health care professional if you feel you are retaining fluid. If you are going to have elective surgery, you may need to stop using this medicine before the surgery. Consult your health care professional for advice. This medicine does not protect you against HIV infection (AIDS) or any other sexually transmitted diseases. What side effects may I notice from receiving this medicine? Side effects that you should report to your doctor or health care professional as soon as possible:  allergic reactions such as skin rash or itching, hives, swelling of the lips, mouth, tongue, or throat  depression  high blood pressure  migraines or severe, sudden headaches  signs and symptoms of a blood clot such as breathing problems; changes in vision; chest pain; severe, sudden headache; pain, swelling, warmth in the leg; trouble speaking; sudden numbness or weakness of the face, arm or leg  signs and symptoms of infection like fever or chills with dizziness and a sunburn-like rash, or pain or trouble passing urine  stomach pain  symptoms of vaginal infection like itching, irritation or unusual discharge  yellowing of the eyes or skin Side effects that usually do not require medical attention (report these to your doctor or health care professional if they continue or are  bothersome):  acne  breast pain, tenderness  irregular vaginal bleeding or spotting, particularly during the first month of use  mild headache  nausea  painful periods  vomiting This list may not describe all possible side effects. Call your doctor for medical advice about side effects. You may report side effects to FDA at 1-800-FDA-1088. Where should I keep my medicine? Keep out of the reach of children. Store unopened medicine for up to 4 months at room temperature at 15 and 30 degrees C (59 and 86 degrees F). Protect from light. Do not store above 30 degrees C (86 degrees F). Throw away any unused medicine 4 months after the dispense date or the expiration date, whichever comes first. A ring may only be used for 1 cycle (1 month). After the 3-week cycle, a used ring is removed and should be placed in the re-closable foil pouch and discarded in the trash out of reach of children and pets. Do NOT flush down the toilet. NOTE: This  sheet is a summary. It may not cover all possible information. If you have questions about this medicine, talk to your doctor, pharmacist, or health care provider.  2020 Elsevier/Gold Standard (2018-08-29 12:31:47)

## 2019-10-31 ENCOUNTER — Encounter: Payer: Self-pay | Admitting: Physician Assistant

## 2019-10-31 ENCOUNTER — Ambulatory Visit: Payer: 59 | Admitting: Physician Assistant

## 2019-10-31 ENCOUNTER — Other Ambulatory Visit: Payer: Self-pay

## 2019-10-31 VITALS — BP 120/70 | HR 82 | Temp 97.9°F | Ht 65.0 in | Wt 121.4 lb

## 2019-10-31 DIAGNOSIS — R5383 Other fatigue: Secondary | ICD-10-CM

## 2019-10-31 DIAGNOSIS — F3181 Bipolar II disorder: Secondary | ICD-10-CM | POA: Insufficient documentation

## 2019-10-31 DIAGNOSIS — M2550 Pain in unspecified joint: Secondary | ICD-10-CM | POA: Diagnosis not present

## 2019-10-31 DIAGNOSIS — E538 Deficiency of other specified B group vitamins: Secondary | ICD-10-CM

## 2019-10-31 NOTE — Progress Notes (Signed)
Sylvia Mathews is a 24 y.o. female is here for follow up.  I acted as a Neurosurgeon for [employer], PA-C Corky Mull, LPN   History of Present Illness:   Chief Complaint  Patient presents with  . numbness and tingling    Bilateral legs  . Joint Pain    hips & knees    HPI   Joint pain Pt still c/o symptoms numbness & tingling in both legs, joint pain hips & knees, fatigue, lightheaded at times. Had unremarkable MRI on 09/23/19. Pt saw Neurology 8/5 and they thought it was related to B12 defiency. Pt says she has been on B12 injections for awhile and does not see any change in symptoms. She is doing B12 shots bi-weekly.  Having severe joint pain. Bilateral hips and knees. Denies swelling in knees. Has achy stiffness in bilateral hips and knees that is overall constant. Is still doing pelvic floor PT for bilateral hip tightness. Also endorses having more loose stools than normal without changes to diet. Fatigue, brain fog, lightheadedness, has limited ability to focus her eyes. She is trying to sleep at night but cannot go to sleep, possibly anxiety related.[name] Has never been on medication for sleep.  She was recently diagnosed with Bipolar 2. She states that she is concerned and even asked her psychiatrist/therapist    Works mostly at home and keeps to herself so she is unable to tell me if this is significantly impacting her work. Does have friends noticing that she is "dropping things more than normal" and sleeping more than normal.  Wt Readings from Last 3 Encounters:  10/31/19 121 lb 6.1 oz (55.1 kg)  09/29/19 125 lb (56.7 kg)  09/25/19 127 lb (57.6 kg)   Wt Readings from Last 10 Encounters:  10/31/19 121 lb 6.1 oz (55.1 kg)  09/29/19 125 lb (56.7 kg)  09/25/19 127 lb (57.6 kg)  09/22/19 126 lb (57.2 kg)  07/30/19 126 lb 12.8 oz (57.5 kg)  06/06/19 122 lb (55.3 kg)  05/14/19 120 lb 8 oz (54.7 kg)  03/27/19 118 lb 4 oz (53.6 kg)  07/12/18 115 lb (52.2 kg)    04/05/18 112 lb (50.8 kg)     Health Maintenance Due  Topic Date Due  . Hepatitis C Screening  Never done    Past Medical History:  Diagnosis Date  . Abnormal uterine bleeding   . Anemia   . Anxiety   . Asthma, childhood, exercise-induced   . Bipolar 2 disorder (HCC)   . Depression   . Dysmenorrhea      Social History   Tobacco Use  . Smoking status: Never Smoker  . Smokeless tobacco: Never Used  Vaping Use  . Vaping Use: Never used  Substance Use Topics  . Alcohol use: Yes    Comment: OCC  . Drug use: Never    Past Surgical History:  Procedure Laterality Date  . WISDOM TOOTH EXTRACTION      Family History  Problem Relation Age of Onset  . Hypothyroidism Mother   . Parkinson's disease Maternal Grandmother   . Lung cancer Maternal Grandmother   . Heart attack Maternal Grandfather   . Hyperlipidemia Maternal Grandfather   . Mental illness Maternal Grandfather   . Schizophrenia Maternal Grandfather   . Heart disease Maternal Grandfather   . Alcohol abuse Paternal Grandfather   . Mood Disorder Maternal Uncle   . Bipolar disorder Maternal Uncle   . Breast cancer Neg Hx   . Colon  cancer Neg Hx     PMHx, SurgHx, SocialHx, FamHx, Medications, and Allergies were reviewed in the Visit Navigator and updated as appropriate.   Patient Active Problem List   Diagnosis Date Noted  . Bipolar 2 disorder (HCC) 10/31/2019  . Heterozygous for MTHFR gene mutation 05/14/2019  . Pain in pelvis 09/23/2018  . Anxiety 09/23/2018  . Depressive disorder 09/23/2018  . Vegetarian 01/09/2018  . Vitamin D deficiency 01/09/2018  . B12 deficiency 01/09/2018  . Menorrhagia 11/28/2012    Social History   Tobacco Use  . Smoking status: Never Smoker  . Smokeless tobacco: Never Used  Vaping Use  . Vaping Use: Never used  Substance Use Topics  . Alcohol use: Yes    Comment: OCC  . Drug use: Never    Current Medications and Allergies:    Current Outpatient Medications:   .  cyanocobalamin (,VITAMIN B-12,) 1000 MCG/ML injection, 1000 mcg (1 mg) injection once per week for four weeks, followed by 1000 mcg injection twice per month., Disp: 1 mL, Rfl: 15 .  etonogestrel-ethinyl estradiol (NUVARING) 0.12-0.015 MG/24HR vaginal ring, Place 1 each vaginally every 28 (twenty-eight) days. Insert vaginally and leave in place for 3 consecutive weeks, then remove for 1 week., Disp: 3 each, Rfl: 2 .  ferrous sulfate 325 (65 FE) MG EC tablet, Take 325 mg by mouth 3 (three) times daily with meals., Disp: , Rfl:  .  L-Methylfolate 15 MG TABS, Take 1 tablet (15 mg total) by mouth daily., Disp: 30 tablet, Rfl: 5 .  Multiple Vitamin (MULTIVITAMIN) tablet, Take 1 tablet by mouth daily., Disp: , Rfl:  .  SYRINGE-NEEDLE, DISP, 3 ML (B-D SYRINGE/NEEDLE 3CC/25GX5/8) 25G X 5/8" 3 ML MISC, 1 each by Does not apply route as needed., Disp: 12 each, Rfl: 0 .  valACYclovir (VALTREX) 1000 MG tablet, Take two tablets ( total 2000 mg) by mouth q12h x 1 day; Start: ASAP after symptom onset, Disp: 30 tablet, Rfl: 1 .  Vitamin D, Ergocalciferol, 50 MCG (2000 UT) CAPS, Take 1 capsule by mouth daily., Disp: , Rfl:    Allergies  Allergen Reactions  . Lamotrigine Rash  . Amoxicillin Rash    Review of Systems   ROS  Negative unless otherwise specified per HPI.  Vitals:   Vitals:   10/31/19 0730  BP: 120/70  Pulse: 82  Temp: 97.9 F (36.6 C)  TempSrc: Temporal  SpO2: 97%  Weight: 121 lb 6.1 oz (55.1 kg)  Height: 5\' 5"  (1.651 m)     Body mass index is 20.2 kg/m.   Physical Exam:    Physical Exam Vitals and nursing note reviewed.  Constitutional:      General: She is not in acute distress.    Appearance: She is well-developed. She is not ill-appearing or toxic-appearing.  Cardiovascular:     Rate and Rhythm: Normal rate and regular rhythm.     Pulses: Normal pulses.     Heart sounds: Normal heart sounds, S1 normal and S2 normal.     Comments: No LE edema Pulmonary:      Effort: Pulmonary effort is normal.     Breath sounds: Normal breath sounds.  Musculoskeletal:     Comments: Bilateral knees with normal ROM Bilateral hips with reported pain with passive flexion.  Skin:    General: Skin is warm and dry.  Neurological:     Mental Status: She is alert.     GCS: GCS eye subscore is 4. GCS verbal subscore is 5. GCS  motor subscore is 6.  Psychiatric:        Speech: Speech normal.        Behavior: Behavior normal. Behavior is cooperative.      Assessment and Plan:    Marceil was seen today for numbness and tingling and joint pain.  Diagnoses and all orders for this visit:  B12 deficiency Will obtain a methylmalonic acid to see how her levels are doing. Will provide further recommendations based on this result. -     Methylmalonic Acid; Future -     Methylmalonic Acid  Arthralgia, unspecified joint; Fatigue, unspecified type Unclear etiology. Recommend auto-immune panel and updating ferritin today. I provided her with a glucometer to trial at times of "weakness" to see if this is possibly due to hypo/hyper-glycemia (patient is an [employer] fund[employer] and is well trained at checking her blood sugar.) If auto-immune labs even the slightest bit abnormal, will refer to rheumatology. Continue to monitor symptoms and keep record for [place]. Also consider PT evaluation. Follow-up in 2-4 weeks. -     Sedimentation rate; Future -     C-reactive protein; Future -     ANA; Future -     Rheumatoid Factor; Future -     Cyclic citrul peptide antibody, IgG ([facility]); Future -     Cyclic citrul peptide antibody, IgG ([facility]) -     ANA -     Rheumatoid Factor -     C-reactive protein -     Sedimentation rate  CMA or LPN served as scribe during this visit. History, Physical, and Plan performed by medical provider. The above documentation has been reviewed and is accurate and complete.  Time spent with patient today was 25 minutes which consisted of chart review, discussing  diagnosis, work up, treatment answering questions and documentation.   Jarold Motto, PA-C [place], [place] 10/31/2019  Follow-up: No follow-ups on file.

## 2019-10-31 NOTE — Patient Instructions (Signed)
It was great to see you!  We will updating your labs today to check for a possible underlying autoimmune issue, updating your B12 as well.  Please use the glucometer prn when feeling shaky. Keep a record.  Follow-up with me in 2-4 weeks so we can revisit all the symptoms and progression.  Take care,  Jarold Motto PA-C

## 2019-11-05 LAB — METHYLMALONIC ACID, SERUM: Methylmalonic Acid, Quant: 90 nmol/L (ref 87–318)

## 2019-11-05 LAB — C-REACTIVE PROTEIN: CRP: 2.6 mg/L (ref <8.0)

## 2019-11-05 LAB — SEDIMENTATION RATE: Sed Rate: 6 mm/h (ref 0–20)

## 2019-11-05 LAB — FERRITIN: Ferritin: 16 ng/mL (ref 16–154)

## 2019-11-05 LAB — CYCLIC CITRUL PEPTIDE ANTIBODY, IGG: Cyclic Citrullin Peptide Ab: <16 UNITS

## 2019-11-05 LAB — RHEUMATOID FACTOR: Rheumatoid fact SerPl-aCnc: <14 IU/mL (ref <14)

## 2019-11-05 LAB — ANA: Anti Nuclear Antibody (ANA): NEGATIVE

## 2019-11-13 ENCOUNTER — Encounter: Payer: Self-pay | Admitting: Physician Assistant

## 2019-11-14 MED ORDER — CYANOCOBALAMIN 1000 MCG/ML IJ SOLN: 1000.0000 ug | INTRAMUSCULAR | 5 refills | Status: DC

## 2019-12-24 ENCOUNTER — Encounter: Payer: Self-pay | Admitting: Physician Assistant

## 2020-01-08 ENCOUNTER — Telehealth: Payer: Self-pay | Admitting: Family Medicine

## 2020-02-13 ENCOUNTER — Other Ambulatory Visit: Payer: Self-pay | Admitting: Physician Assistant

## 2020-02-27 ENCOUNTER — Other Ambulatory Visit: Payer: Self-pay

## 2020-02-27 DIAGNOSIS — Z20822 Contact with and (suspected) exposure to covid-19: Secondary | ICD-10-CM

## 2020-03-01 ENCOUNTER — Other Ambulatory Visit: Payer: Self-pay | Admitting: Physician Assistant

## 2020-03-02 LAB — NOVEL CORONAVIRUS, NAA: SARS-CoV-2, NAA: NOT DETECTED

## 2020-03-10 ENCOUNTER — Telehealth (INDEPENDENT_AMBULATORY_CARE_PROVIDER_SITE_OTHER): Payer: BC Managed Care – PPO | Admitting: Physician Assistant

## 2020-03-10 ENCOUNTER — Ambulatory Visit: Payer: Self-pay | Admitting: Physician Assistant

## 2020-03-10 ENCOUNTER — Encounter: Payer: Self-pay | Admitting: Physician Assistant

## 2020-03-10 VITALS — Ht 65.0 in | Wt 125.0 lb

## 2020-03-10 DIAGNOSIS — N760 Acute vaginitis: Secondary | ICD-10-CM

## 2020-03-10 MED ORDER — FLUCONAZOLE 150 MG PO TABS: 150.0000 mg | ORAL_TABLET | Freq: Once | ORAL | 0 refills | Status: AC

## 2020-03-10 MED ORDER — METRONIDAZOLE 500 MG PO TABS: 500.0000 mg | ORAL_TABLET | Freq: Two times a day (BID) | ORAL | 0 refills | Status: DC

## 2020-03-10 MED ORDER — METRONIDAZOLE 0.75 % VA GEL: 1.0000 | VAGINAL | 1 refills | Status: DC

## 2020-03-10 NOTE — Progress Notes (Signed)
Virtual Visit via Video   I connected with Sylvia Mathews on 03/10/20 at 12:00 PM EST by a video enabled telemedicine application and verified that I am speaking with the correct person using two identifiers. Location patient: Home Location provider: [facility], Office Persons participating in the virtual visit: Sylvia Mathews, Sylvia Motto PA-C, Sylvia Mull, LPN   I discussed the limitations of evaluation and management by telemedicine and the availability of in person appointments. The patient expressed understanding and agreed to proceed.  I acted as a Neurosurgeon for [employer], PA-C [employer], LPN   Subjective:   HPI:   Vaginal discharge Pt c/o yellow/ green vaginal discharge started beginning of Jan 5th. Also c/o vaginal itching. Pt was treated in Dec for essentially the exact same symptoms, she did a mail-in test, the test was positive for BV only, no STDs. She took flagyl for this and symptoms returned about a week after completing treatment. Pt denies pelvic pain but did have some pain with intercourse recently. Denies fever or chills, new cleansers/soaps/detergents.  She has had 3 episodes of BV since starting use of NuvaRing over last summer.     ROS: See pertinent positives and negatives per HPI.  Patient Active Problem List   Diagnosis Date Noted  . Bipolar 2 disorder (HCC) 10/31/2019  . Heterozygous for MTHFR gene mutation 05/14/2019  . Pain in pelvis 09/23/2018  . Anxiety 09/23/2018  . Depressive disorder 09/23/2018  . Vegetarian 01/09/2018  . Vitamin D deficiency 01/09/2018  . B12 deficiency 01/09/2018  . Menorrhagia 11/28/2012    Social History   Tobacco Use  . Smoking status: Never Smoker  . Smokeless tobacco: Never Used  Substance Use Topics  . Alcohol use: Yes    Comment: OCC    Current Outpatient Medications:  .  cyanocobalamin (,VITAMIN B-12,) 1000 MCG/ML injection, Inject 1 mL (1,000 mcg total) into the muscle  every 14 (fourteen) days., Disp: 4 mL, Rfl: 5 .  etonogestrel-ethinyl estradiol (NUVARING) 0.12-0.015 MG/24HR vaginal ring, Place 1 each vaginally every 28 (twenty-eight) days. Insert vaginally and leave in place for 3 consecutive weeks, then remove for 1 week., Disp: 3 each, Rfl: 2 .  ferrous sulfate 325 (65 FE) MG EC tablet, Take 325 mg by mouth daily with breakfast., Disp: , Rfl:  .  fluconazole (DIFLUCAN) 150 MG tablet, Take 1 tablet (150 mg total) by mouth once for 1 dose., Disp: 1 tablet, Rfl: 0 .  L-Methylfolate 15 MG TABS, Take 1 tablet (15 mg total) by mouth daily., Disp: 30 tablet, Rfl: 5 .  metroNIDAZOLE (FLAGYL) 500 MG tablet, Take 1 tablet (500 mg total) by mouth 2 (two) times daily., Disp: 14 tablet, Rfl: 0 .  [START ON 03/11/2020] metroNIDAZOLE (METROGEL) 0.75 % vaginal gel, Place 1 Applicatorful vaginally 2 (two) times a week., Disp: 70 g, Rfl: 1 .  Multiple Vitamin (MULTIVITAMIN) tablet, Take 1 tablet by mouth daily., Disp: , Rfl:  .  SYRINGE-NEEDLE, DISP, 3 ML (B-D SYRINGE/NEEDLE 3CC/25GX5/8) 25G X 5/8" 3 ML MISC, 1 each by Does not apply route as needed., Disp: 12 each, Rfl: 0 .  valACYclovir (VALTREX) 1000 MG tablet, TAKE 2 TABLETS BY MOUTH EVERY 12 HOURS FOR 1 DAY. START ASAP AFTER SYSTEM ONSET, Disp: 180 tablet, Rfl: 1 .  Vitamin D, Ergocalciferol, 50 MCG (2000 UT) CAPS, Take 1 capsule by mouth daily., Disp: , Rfl:   Allergies  Allergen Reactions  . Lamotrigine Rash  . Amoxicillin Rash  Objective:   VITALS: Per patient if applicable, see vitals. GENERAL: Alert, appears well and in no acute distress. HEENT: Atraumatic, conjunctiva clear, no obvious abnormalities on inspection of external nose and ears. NECK: Normal movements of the head and neck. CARDIOPULMONARY: No increased WOB. Speaking in clear sentences. I:E ratio WNL.  MS: Moves all visible extremities without noticeable abnormality. PSYCH: Pleasant and cooperative, well-groomed. Speech normal rate and rhythm.  Affect is appropriate. Insight and judgement are appropriate. Attention is focused, linear, and appropriate.  NEURO: CN grossly intact. Oriented as arrived to appointment on time with no prompting. Moves both UE equally.  SKIN: No obvious lesions, wounds, erythema, or cyanosis noted on face or hands.  Assessment and Plan:   Sylvia Mathews was seen today for vaginal discharge.  Diagnoses and all orders for this visit:  Acute vaginitis  Other orders -     metroNIDAZOLE (FLAGYL) 500 MG tablet; Take 1 tablet (500 mg total) by mouth 2 (two) times daily. -     fluconazole (DIFLUCAN) 150 MG tablet; Take 1 tablet (150 mg total) by mouth once for 1 dose. -     metroNIDAZOLE (METROGEL) 0.75 % vaginal gel; Place 1 Applicatorful vaginally 2 (two) times a week.   Suspect recurrent BV. Third episode in 6 months. Start oral flagyl. Take one oral diflucan tablet after completion of oral flagyl. Then for possible suppressive therapy will trial twice weekly metrogel. Worsening precautions advised. May need to follow-up with Dr. Oscar La to discuss alternative contraceptive.  I discussed the assessment and treatment plan with the patient. The patient was provided an opportunity to ask questions and all were answered. The patient agreed with the plan and demonstrated an understanding of the instructions.   The patient was advised to call back or seek an in-person evaluation if the symptoms worsen or if the condition fails to improve as anticipated.   CMA or LPN served as scribe during this visit. History, Physical, and Plan performed by medical provider. The above documentation has been reviewed and is accurate and complete.  [place], [place] 03/10/2020

## 2020-04-09 ENCOUNTER — Ambulatory Visit: Payer: BC Managed Care – PPO | Admitting: Physician Assistant

## 2020-05-12 ENCOUNTER — Telehealth (INDEPENDENT_AMBULATORY_CARE_PROVIDER_SITE_OTHER): Payer: BC Managed Care – PPO | Admitting: Physician Assistant

## 2020-05-12 DIAGNOSIS — N3001 Acute cystitis with hematuria: Secondary | ICD-10-CM | POA: Diagnosis not present

## 2020-05-12 DIAGNOSIS — U071 COVID-19: Secondary | ICD-10-CM | POA: Diagnosis not present

## 2020-05-12 MED ORDER — NITROFURANTOIN MONOHYD MACRO 100 MG PO CAPS: 100.0000 mg | ORAL_CAPSULE | Freq: Two times a day (BID) | ORAL | 0 refills | Status: AC

## 2020-05-12 NOTE — Progress Notes (Signed)
Virtual Visit via Video Note  I connected with Sylvia Mathews on 05/12/20 at  2:00 PM EDT by a video enabled telemedicine application and verified that I am speaking with the correct person using two identifiers.  Location: Patient: home Provider: Nature conservation officer at [employer]   I discussed the limitations of evaluation and management by telemedicine and the availability of in person appointments. The patient expressed understanding and agreed to proceed.   Only the patient and myself were present for today's video call.   History of Present Illness: She has COVID-19. At home test confirmed this on 05/10/20. Low grade fever and some congestion, otherwise feels ok in regard to COVID symptoms. She had both vaccines and a booster, but says booster was about 5 months ago. Thinks she might have caught it from going to the beach.  Frequent urination, abdominal / pelvic pressure, hematuria, dysuria started this morning. She started taking AZO today, which is helping. She has had UTIs in the past and thinks this feels like another one.    Observations/Objective:  Gen: Awake, alert, no acute distress Resp: Breathing is even and non-labored Psych: calm/pleasant demeanor Neuro: Alert and Oriented x 3, + facial symmetry, speech is clear.   Assessment and Plan: 1. COVID-19 Positive COVID-19 per home test on 05/10/2020.  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  Her symptoms are very mild.  She is able to manage with at home supportive care such as Tylenol, ibuprofen, vitamin C, vitamin D, and zinc.  She is self isolating at home.  She knows to call back if symptoms worsen or change at all.  She will go to the emergency department in case of sudden shortness of breath, chest pain, or severe weakness.  2. Acute cystitis with hematuria Symptoms do sound consistent with acute cystitis.  I will start her on Macrobid and she  will take this twice daily.  She is going to increase her fluids and take Azo the next 2 days as needed.  If still no improvement, we will need to obtain a urine sample and send this off for culture.  Patient is understanding and agreeable.   Follow Up Instructions:    I discussed the assessment and treatment plan with the patient. The patient was provided an opportunity to ask questions and all were answered. The patient agreed with the plan and demonstrated an understanding of the instructions.   The patient was advised to call back or seek an in-person evaluation if the symptoms worsen or if the condition fails to improve as anticipated.  This note was prepared with assistance of Conservation officer, historic buildings. Occasional wrong-word or sound-a-like substitutions may have occurred due to the inherent limitations of voice recognition software.  Adolfo Granieri M Mera Gunkel, PA-C

## 2020-05-12 NOTE — Patient Instructions (Signed)
Please call if worsening or no improvement of symptoms.

## 2020-05-19 ENCOUNTER — Other Ambulatory Visit: Payer: Self-pay | Admitting: Psychiatry

## 2020-05-19 DIAGNOSIS — Z1589 Genetic susceptibility to other disease: Secondary | ICD-10-CM

## 2020-05-19 NOTE — Telephone Encounter (Signed)
Please schedule appt

## 2020-05-19 NOTE — Telephone Encounter (Signed)
LM for Ivonne to call for appt in order to continue medication management

## 2020-05-21 NOTE — Telephone Encounter (Signed)
Appt scheduled

## 2020-05-25 ENCOUNTER — Other Ambulatory Visit: Payer: Self-pay | Admitting: Physician Assistant

## 2020-06-22 ENCOUNTER — Other Ambulatory Visit: Payer: Self-pay | Admitting: Obstetrics and Gynecology

## 2020-06-22 NOTE — Telephone Encounter (Signed)
Medication refill request: nuvaring Last OV:  09-29-2019 for pelvic pain Next AEX: not scheduled Last MMG (if hormonal medication request): none Refill authorized: please approve if appropriate. Pharmacy note states patient needs to call to schedule yearly exam.

## 2020-09-10 ENCOUNTER — Other Ambulatory Visit: Payer: Self-pay | Admitting: Physician Assistant

## 2020-09-29 ENCOUNTER — Other Ambulatory Visit: Payer: Self-pay | Admitting: Obstetrics and Gynecology

## 2020-10-07 ENCOUNTER — Encounter (HOSPITAL_COMMUNITY): Payer: Self-pay | Admitting: Emergency Medicine

## 2020-10-07 ENCOUNTER — Ambulatory Visit (HOSPITAL_COMMUNITY)
Admission: EM | Admit: 2020-10-07 | Discharge: 2020-10-07 | Disposition: A | Discharge status: Home / Self Care | Payer: BC Managed Care – PPO | Attending: Family Medicine | Admitting: Family Medicine

## 2020-10-07 ENCOUNTER — Other Ambulatory Visit: Payer: Self-pay

## 2020-10-07 DIAGNOSIS — N3090 Cystitis, unspecified without hematuria: Secondary | ICD-10-CM | POA: Diagnosis not present

## 2020-10-07 DIAGNOSIS — R300 Dysuria: Secondary | ICD-10-CM | POA: Diagnosis not present

## 2020-10-07 LAB — POCT URINALYSIS DIPSTICK, ED / UC
Glucose, UA: 250 mg/dL — AB
Ketones, ur: 15 mg/dL — AB
Nitrite: POSITIVE — AB
Protein, ur: 100 mg/dL — AB
Specific Gravity, Urine: <=1.005 (ref 1.005–1.030)
Urobilinogen, UA: 4 mg/dL — ABNORMAL HIGH (ref 0.0–1.0)
pH: 5 (ref 5.0–8.0)

## 2020-10-07 MED ORDER — SULFAMETHOXAZOLE-TRIMETHOPRIM 800-160 MG PO TABS: 1.0000 | ORAL_TABLET | Freq: Two times a day (BID) | ORAL | 0 refills | Status: AC

## 2020-10-07 NOTE — Discharge Instructions (Addendum)
You have had labs (urine culture) sent today. We will call you with any significant abnormalities or if there is need to begin or change treatment or pursue further follow up.  You may also review your test results online through MyChart. If you do not have a MyChart account, instructions to sign up should be on your discharge paperwork.  

## 2020-10-07 NOTE — ED Triage Notes (Signed)
Pt said yesterday started having pelvic and abdominal; pain with burning with uriantion

## 2020-10-07 NOTE — ED Provider Notes (Signed)
[facility]    ASSESSMENT & PLAN:  1. Dysuria   2. Cystitis    Begin: Meds ordered this encounter  Medications   sulfamethoxazole-trimethoprim (BACTRIM DS) 800-160 MG tablet    Sig: Take 1 tablet by mouth 2 (two) times daily for 5 days.    Dispense:  10 tablet    Refill:  0   No signs of pyelonephritis. Discussed. Urine culture sent. May follow up with her PCP or here if not showing improvement over the next 48 hours, sooner if needed.  Outlined signs and symptoms indicating need for more acute intervention. Patient verbalized understanding. After Visit Summary given.  SUBJECTIVE:  Sylvia Mathews is a 25 y.o. adult who complains of urinary frequency, urgency and dysuria for the past day. Without associated flank pain, fever, chills, vaginal discharge or bleeding. Mild lower abd discomfort. Gross hematuria: not present. No specific aggravating or alleviating factors reported. No LE edema. Normal PO intake without n/v/d. Without specific abdominal pain. Ambulatory without difficulty. OTC treatment: AZO; mild help. H/O UTI: occasional.  OBJECTIVE:  Vitals:   10/07/20 1918  BP: 130/78  Pulse: 86  Resp: 16  Temp: 98.5 F (36.9 C)  TempSrc: Oral  SpO2: 99%   General appearance: alert; no distress Lungs: unlabored respirations Abdomen: soft Back: no CVA tenderness Extremities: no edema; symmetrical with no gross deformities Skin: warm and dry Neurologic: normal gait Psychological: alert and cooperative; normal mood and affect  Labs Reviewed  POCT URINALYSIS DIPSTICK, ED / UC    Allergies  Allergen Reactions   Lamotrigine Rash   Amoxicillin Rash    Past Medical History:  Diagnosis Date   Abnormal uterine bleeding    Anemia    Anxiety    Asthma, childhood, exercise-induced    Bipolar 2 disorder (HCC)    Depression    Dysmenorrhea    Social History   Socioeconomic History   Marital status: Single    Spouse name: Not on file    Number of children: Not on file   Years of education: Not on file   Highest education level: Bachelor's degree (e.g., BA, AB, BS)  Occupational History   Occupation: Teacher, adult education: [employer]    Comment: LABOR AND DELIVERY  Tobacco Use   Smoking status: Never   Smokeless tobacco: Never  Vaping Use   Vaping Use: Never used  Substance and Sexual Activity   Alcohol use: Yes    Comment: OCC   Drug use: Never   Sexual activity: Yes    Partners: Female, Female    Birth control/protection: I.U.D.  Other Topics Concern   Not on file  Social History Narrative   RN at [employer], works with new moms.   She lives alone, no children.    Social Determinants of Health   Financial Resource Strain: Not on file  Food Insecurity: Not on file  Transportation Needs: Not on file  Physical Activity: Not on file  Stress: Not on file  Social Connections: Not on file  Intimate Partner Violence: Not on file   Family History  Problem Relation Age of Onset   Hypothyroidism Mother    Parkinson's disease Maternal Grandmother    Lung cancer Maternal Grandmother    Heart attack Maternal Grandfather    Hyperlipidemia Maternal Grandfather    Mental illness Maternal Grandfather    Schizophrenia Maternal Grandfather    Heart disease Maternal Grandfather    Alcohol abuse Paternal Grandfather    Mood  Disorder Maternal Uncle    Bipolar disorder Maternal Uncle    Breast cancer Neg Hx    Colon cancer Neg Hx         Mardella Layman, MD 10/07/20 2014

## 2020-10-10 LAB — URINE CULTURE: Culture: 20000 — AB

## 2020-10-11 ENCOUNTER — Telehealth (HOSPITAL_COMMUNITY): Payer: Self-pay | Admitting: Emergency Medicine

## 2020-10-11 MED ORDER — NITROFURANTOIN MONOHYD MACRO 100 MG PO CAPS: 100.0000 mg | ORAL_CAPSULE | Freq: Two times a day (BID) | ORAL | 0 refills | Status: AC

## 2020-11-01 ENCOUNTER — Other Ambulatory Visit: Payer: Self-pay | Admitting: Obstetrics and Gynecology

## 2020-12-16 ENCOUNTER — Other Ambulatory Visit: Payer: Self-pay | Admitting: Obstetrics and Gynecology

## 2020-12-22 ENCOUNTER — Other Ambulatory Visit: Payer: Self-pay | Admitting: Obstetrics and Gynecology

## 2020-12-22 MED ORDER — ETONOGESTREL-ETHINYL ESTRADIOL 0.12-0.015 MG/24HR VA RING: VAGINAL_RING | VAGINAL | 0 refills | Status: AC

## 2020-12-22 NOTE — Telephone Encounter (Signed)
Note placed on Rx from patient " I am in the process of moving to [place] and have an appointment scheduled for a yearly exam, however, I will be one week where I need this prescription and won't have it, is it possible to please get one month refill to fill that gap?"  Last office visit 09/29/2019

## 2021-01-17 ENCOUNTER — Other Ambulatory Visit: Payer: Self-pay | Admitting: Obstetrics and Gynecology

## 2021-10-06 IMAGING — MR MR HEAD WO/W CM
14 series · 48 of 48 positions shown · IV contrast (gadavist)
Comparison: No pertinent prior exams are available for comparison.

CLINICAL DATA: Neurologic deficit, transient. Mental status change,
unknown cause. Additional history provided: Ongoing headaches and
dizziness, symptoms for 6 months, numbness on left side.

EXAM:
MRI HEAD WITHOUT AND WITH CONTRAST
TECHNIQUE: Multiplanar, multiecho pulse sequences of the brain and surrounding
structures were obtained without and with intravenous contrast.
CONTRAST:  5mL GADAVIST GADOBUTROL 1 MMOL/ML IV SOLN

[Series 5: DWI · axial · 3.0mm · 1.25mm/px · z∈[-51,+101]mm · 7 of 104 slices shown (1 of 4)]
[im 1/104]
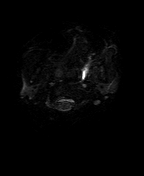
[im 18/104]
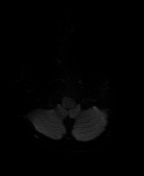
[im 35/104]
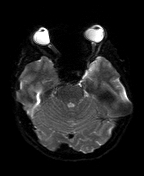
[im 52/104]
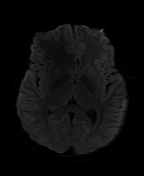
[im 69/104]
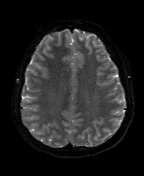
[im 86/104]
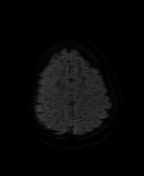
[im 104/104]
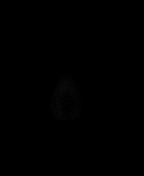

[Series 6: DWI · axial · 3.0mm · 1.25mm/px · z∈[-51,+101]mm · 3 of 52 slices shown (2 of 4)]
[im 1/52]
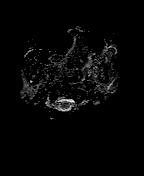
[im 26/52]
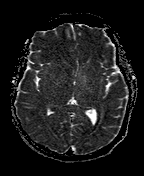
[im 52/52]
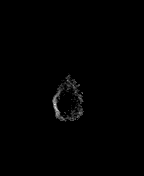

[Series 7: T1 · sagittal · 5.0mm · 0.66mm/px · 1 of 26 slices shown (1 of 2)]
[im 1/26]
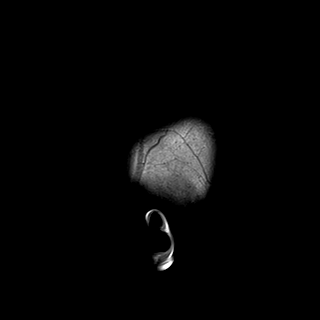

[Series 8: T2 · axial · 5.0mm · 0.55mm/px · 1 of 24 slices shown]
[im 1/24]
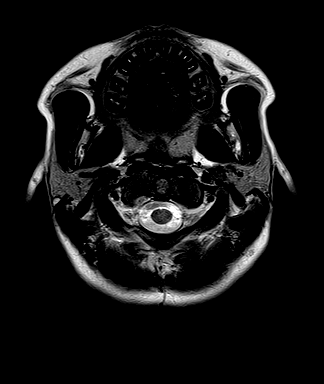

[Series 9: mip_images(sw) · axial · 32.0mm · 0.66mm/px · z∈[-44,+82]mm · 2 of 33 slices shown]
[im 1/33]
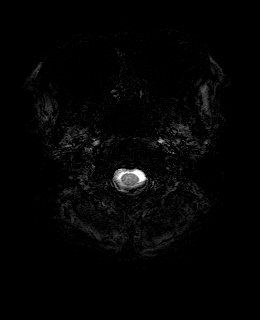
[im 33/33]
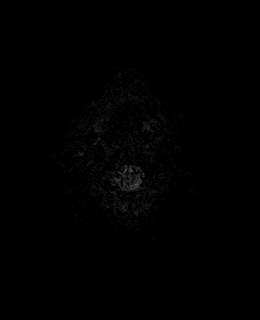

[Series 10: swi_images · axial · 4.0mm · 0.66mm/px · z∈[-58,+96]mm · 2 of 40 slices shown]
[im 1/40]
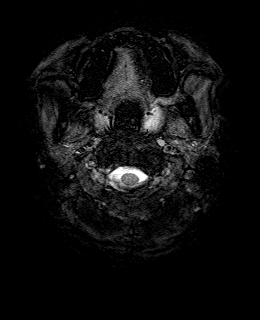
[im 40/40]
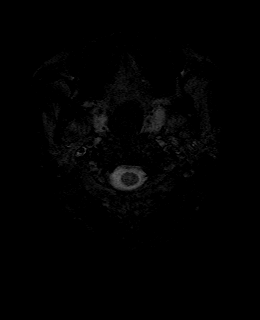

[Series 11: FLAIR · axial · 3.0mm · 0.66mm/px · z∈[-56,+94]mm · 3 of 52 slices shown]
[im 1/52]
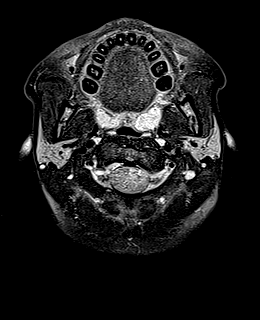
[im 26/52]
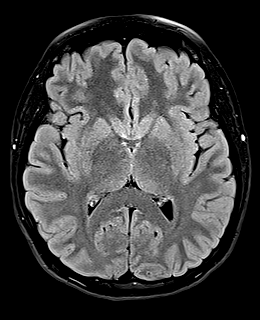
[im 52/52]
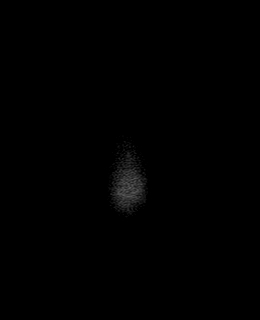

[Series 12: T1 · axial · 1.0mm · 0.82mm/px · z∈[-57,+100]mm · 9 of 160 slices shown (2 of 2)]
[im 1/160]
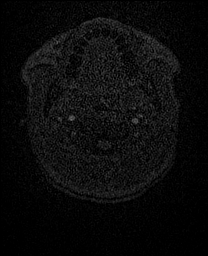
[im 20/160]
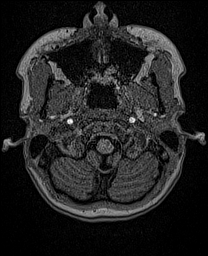
[im 40/160]
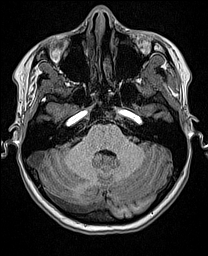
[im 60/160]
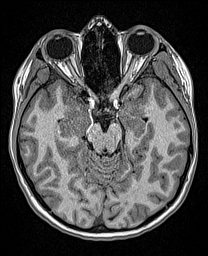
[im 80/160]
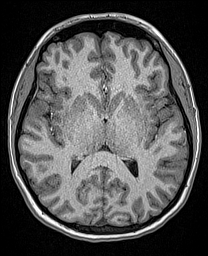
[im 100/160]
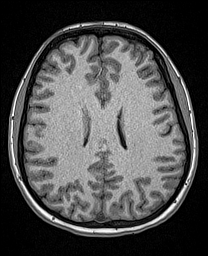
[im 120/160]
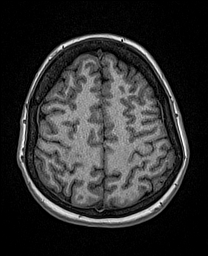
[im 140/160]
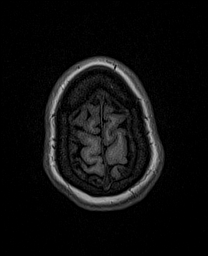
[im 160/160]
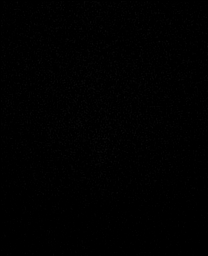

[Series 13: DWI · coronal · 5.0mm · 1.19mm/px · 4 of 72 slices shown (3 of 4)]
[im 1/72]
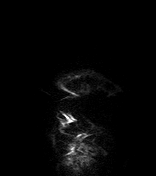
[im 24/72]
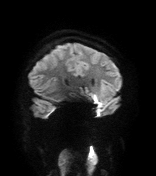
[im 48/72]
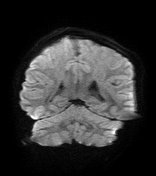
[im 72/72]
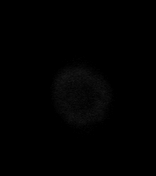

[Series 14: DWI · coronal · 5.0mm · 1.19mm/px · 2 of 36 slices shown (4 of 4)]
[im 1/36]
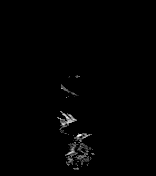
[im 36/36]
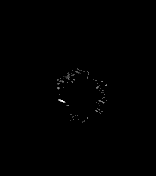

[Series 15: T2 post-contrast · coronal · 5.0mm · 0.55mm/px · 2 of 27 slices shown]
[im 1/27]
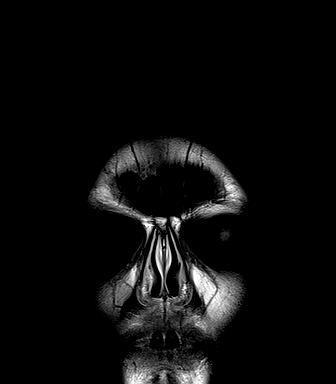
[im 27/27]
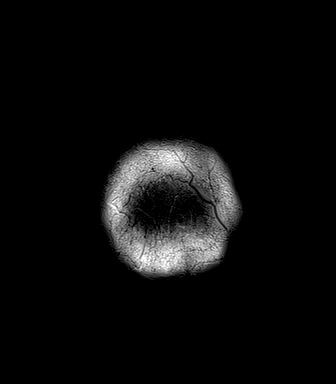

[Series 16: T1 post-contrast · axial · 1.0mm · 0.82mm/px · z∈[-57,+100]mm · 9 of 160 slices shown (1 of 3)]
[im 1/160]
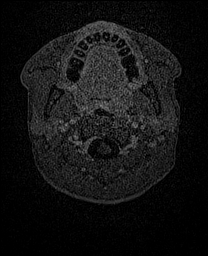
[im 20/160]
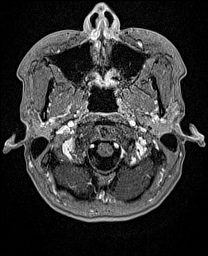
[im 40/160]
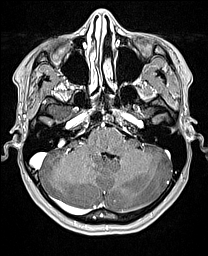
[im 60/160]
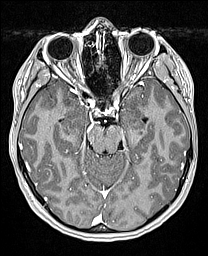
[im 80/160]
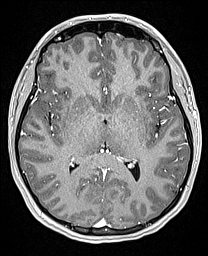
[im 100/160]
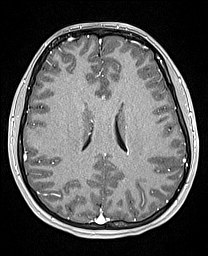
[im 120/160]
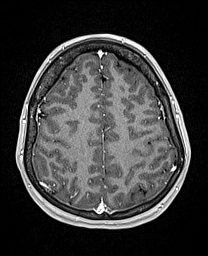
[im 140/160]
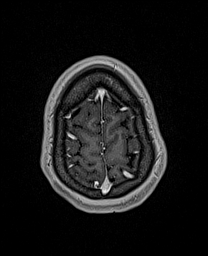
[im 160/160]
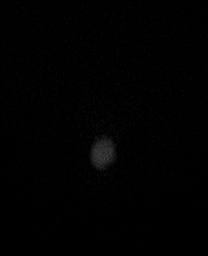

[Series 17: T1 post-contrast · coronal · 5.0mm · 0.43mm/px · 2 of 27 slices shown (2 of 3)]
[im 1/27]
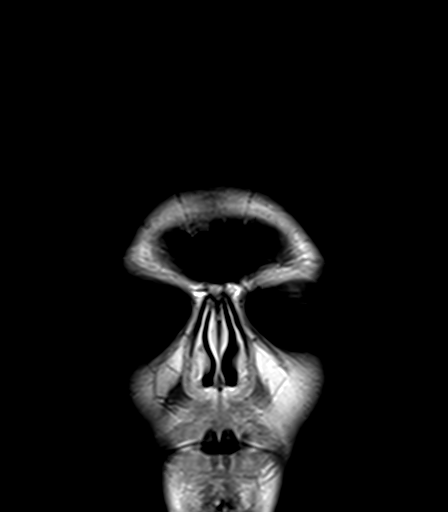
[im 27/27]
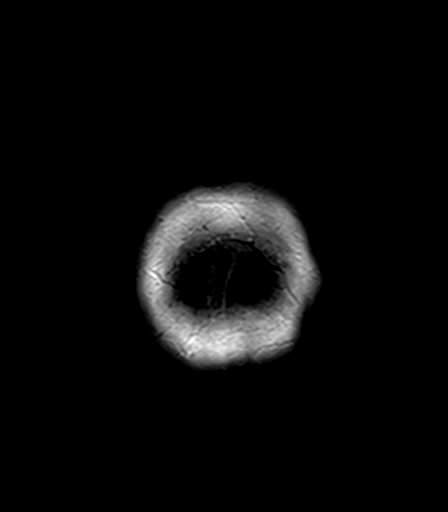

[Series 18: T1 post-contrast · sagittal · 5.0mm · 0.66mm/px · 1 of 26 slices shown (3 of 3)]
[im 1/26]
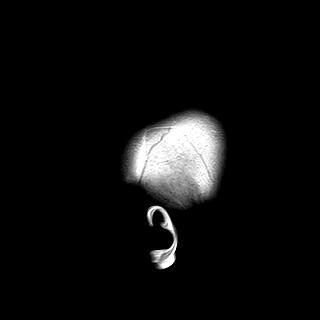

[48 of 48 positions shown; findings below may reference images not displayed]

FINDINGS: Brain:

Cerebral volume is normal for age.

No focal parenchymal signal abnormality or abnormal intracranial
enhancement is identified.

There is no acute infarct.

No evidence of intracranial mass.

No chronic intracranial blood products.

No extra-axial fluid collection.

No midline shift.

Vascular: Expected proximal arterial flow voids.

Skull and upper cervical spine: No focal marrow lesion.

Sinuses/Orbits: Visualized orbits show no acute finding. No
significant paranasal sinus disease or mastoid effusion.
IMPRESSION: Unremarkable MRI appearance of the brain. No evidence of acute
intracranial abnormality.

## 2021-11-14 ENCOUNTER — Encounter: Payer: Self-pay | Admitting: *Deleted

## 2022-02-02 ENCOUNTER — Encounter: Payer: Self-pay | Admitting: *Deleted

## 2023-01-03 ENCOUNTER — Encounter: Payer: Self-pay | Admitting: Psychiatry
